# Patient Record
Sex: Female | Born: 1935 | Race: White | Hispanic: No
Health system: Southern US, Community
[De-identification: ages and names within clinical notes are randomized; demographics above are authoritative.]

---

## 2011-06-10 ENCOUNTER — Emergency Department: Payer: Self-pay | Admitting: *Deleted

## 2011-06-10 LAB — COMPREHENSIVE METABOLIC PANEL
Alkaline Phosphatase: 77 U/L (ref 50–136)
BUN: 33 mg/dL — ABNORMAL HIGH (ref 7–18)
Calcium, Total: 9.3 mg/dL (ref 8.5–10.1)
Chloride: 104 mmol/L (ref 98–107)
Co2: 26 mmol/L (ref 21–32)
EGFR (African American): 51 — ABNORMAL LOW
EGFR (Non-African Amer.): 42 — ABNORMAL LOW
Glucose: 126 mg/dL — ABNORMAL HIGH (ref 65–99)
SGOT(AST): 15 U/L (ref 15–37)
SGPT (ALT): 18 U/L
Sodium: 141 mmol/L (ref 136–145)
Total Protein: 6.2 g/dL — ABNORMAL LOW (ref 6.4–8.2)

## 2011-06-10 LAB — CBC
HGB: 11.2 g/dL — ABNORMAL LOW (ref 12.0–16.0)
MCHC: 33.3 g/dL (ref 32.0–36.0)
Platelet: 154 10*3/uL (ref 150–440)
RDW: 16.2 % — ABNORMAL HIGH (ref 11.5–14.5)
WBC: 6.1 10*3/uL (ref 3.6–11.0)

## 2011-06-11 LAB — TROPONIN I: Troponin-I: <0.02 ng/mL

## 2011-08-17 ENCOUNTER — Emergency Department: Payer: Self-pay | Admitting: Emergency Medicine

## 2011-08-17 LAB — COMPREHENSIVE METABOLIC PANEL
Albumin: 3.5 g/dL (ref 3.4–5.0)
Anion Gap: 8 (ref 7–16)
Bilirubin,Total: 0.2 mg/dL (ref 0.2–1.0)
Calcium, Total: 9.6 mg/dL (ref 8.5–10.1)
Creatinine: 0.66 mg/dL (ref 0.60–1.30)
EGFR (African American): >60
EGFR (Non-African Amer.): >60
SGPT (ALT): 11 U/L — ABNORMAL LOW
Sodium: 141 mmol/L (ref 136–145)
Total Protein: 6.5 g/dL (ref 6.4–8.2)

## 2011-08-17 LAB — URINALYSIS, COMPLETE
Blood: NEGATIVE
Glucose,UR: NEGATIVE mg/dL (ref 0–75)
Ketone: NEGATIVE
Protein: NEGATIVE
RBC,UR: 4 /HPF (ref 0–5)
Specific Gravity: 1.008 (ref 1.003–1.030)
Squamous Epithelial: 1
WBC UR: 4 /HPF (ref 0–5)

## 2011-08-17 LAB — CBC
HCT: 34.6 % — ABNORMAL LOW (ref 35.0–47.0)
HGB: 11.2 g/dL — ABNORMAL LOW (ref 12.0–16.0)
MCHC: 32.3 g/dL (ref 32.0–36.0)
MCV: 95 fL (ref 80–100)
Platelet: 149 10*3/uL — ABNORMAL LOW (ref 150–440)
RDW: 14.9 % — ABNORMAL HIGH (ref 11.5–14.5)
WBC: 6 10*3/uL (ref 3.6–11.0)

## 2011-08-17 LAB — TROPONIN I: Troponin-I: 0.05 ng/mL

## 2011-08-17 LAB — PRO B NATRIURETIC PEPTIDE: B-Type Natriuretic Peptide: 504 pg/mL — ABNORMAL HIGH (ref 0–450)

## 2011-08-18 LAB — TROPONIN I: Troponin-I: 0.05 ng/mL

## 2011-08-23 LAB — CULTURE, BLOOD (SINGLE)

## 2011-08-25 ENCOUNTER — Inpatient Hospital Stay: Payer: Self-pay | Admitting: Internal Medicine

## 2011-08-25 LAB — COMPREHENSIVE METABOLIC PANEL
Albumin: 3.7 g/dL (ref 3.4–5.0)
Bilirubin,Total: 0.3 mg/dL (ref 0.2–1.0)
Calcium, Total: 9.9 mg/dL (ref 8.5–10.1)
Chloride: 99 mmol/L (ref 98–107)
Co2: 31 mmol/L (ref 21–32)
Creatinine: 0.57 mg/dL — ABNORMAL LOW (ref 0.60–1.30)
Glucose: 99 mg/dL (ref 65–99)
Osmolality: 275 (ref 275–301)
Potassium: 4.3 mmol/L (ref 3.5–5.1)
SGOT(AST): 17 U/L (ref 15–37)
SGPT (ALT): 10 U/L — ABNORMAL LOW
Sodium: 137 mmol/L (ref 136–145)
Total Protein: 6.5 g/dL (ref 6.4–8.2)

## 2011-08-25 LAB — CBC
HCT: 32.7 % — ABNORMAL LOW (ref 35.0–47.0)
MCH: 30.9 pg (ref 26.0–34.0)
Platelet: 145 10*3/uL — ABNORMAL LOW (ref 150–440)
RBC: 3.47 10*6/uL — ABNORMAL LOW (ref 3.80–5.20)
RDW: 14.1 % (ref 11.5–14.5)
WBC: 4.9 10*3/uL (ref 3.6–11.0)

## 2011-08-25 LAB — URINALYSIS, COMPLETE
Bacteria: NONE SEEN
Bilirubin,UR: NEGATIVE
Ketone: NEGATIVE
Leukocyte Esterase: NEGATIVE
Ph: 8 (ref 4.5–8.0)
RBC,UR: NONE SEEN /HPF (ref 0–5)
Specific Gravity: 1.005 (ref 1.003–1.030)
Squamous Epithelial: NONE SEEN
WBC UR: <1 /HPF (ref 0–5)

## 2011-08-25 LAB — TSH: Thyroid Stimulating Horm: 0.811 u[IU]/mL

## 2011-08-25 LAB — LIPASE, BLOOD: Lipase: 87 U/L (ref 73–393)

## 2011-08-26 LAB — CBC WITH DIFFERENTIAL/PLATELET
Basophil %: 0.7 %
Eosinophil %: 3 %
HCT: 32.9 % — ABNORMAL LOW (ref 35.0–47.0)
HGB: 10.6 g/dL — ABNORMAL LOW (ref 12.0–16.0)
Lymphocyte #: 1 10*3/uL (ref 1.0–3.6)
Lymphocyte %: 20 %
MCV: 95 fL (ref 80–100)
Monocyte #: 0.6 x10 3/mm (ref 0.2–0.9)
Monocyte %: 11.5 %
Neutrophil #: 3.1 10*3/uL (ref 1.4–6.5)
Platelet: 150 10*3/uL (ref 150–440)
RBC: 3.47 10*6/uL — ABNORMAL LOW (ref 3.80–5.20)
RDW: 14.4 % (ref 11.5–14.5)
WBC: 4.8 10*3/uL (ref 3.6–11.0)

## 2011-08-26 LAB — BASIC METABOLIC PANEL
Anion Gap: 7 (ref 7–16)
BUN: 15 mg/dL (ref 7–18)
Calcium, Total: 9.6 mg/dL (ref 8.5–10.1)
Chloride: 101 mmol/L (ref 98–107)
Co2: 32 mmol/L (ref 21–32)
EGFR (African American): >60
Glucose: 107 mg/dL — ABNORMAL HIGH (ref 65–99)
Osmolality: 281 (ref 275–301)
Sodium: 140 mmol/L (ref 136–145)

## 2011-08-31 ENCOUNTER — Encounter: Payer: Self-pay | Admitting: Internal Medicine

## 2011-09-11 ENCOUNTER — Encounter: Payer: Self-pay | Admitting: Internal Medicine

## 2011-10-08 ENCOUNTER — Inpatient Hospital Stay: Payer: Self-pay | Admitting: Internal Medicine

## 2011-10-08 LAB — CBC
HCT: 29.8 % — ABNORMAL LOW (ref 35.0–47.0)
HGB: 9.9 g/dL — ABNORMAL LOW (ref 12.0–16.0)
MCH: 30.9 pg (ref 26.0–34.0)
Platelet: 147 10*3/uL — ABNORMAL LOW (ref 150–440)
RBC: 3.21 10*6/uL — ABNORMAL LOW (ref 3.80–5.20)
RDW: 14.1 % (ref 11.5–14.5)
WBC: 7.1 10*3/uL (ref 3.6–11.0)

## 2011-10-08 LAB — URINALYSIS, COMPLETE
Glucose,UR: NEGATIVE mg/dL (ref 0–75)
Ketone: NEGATIVE
WBC UR: 65 /HPF (ref 0–5)

## 2011-10-08 LAB — COMPREHENSIVE METABOLIC PANEL
Albumin: 3.3 g/dL — ABNORMAL LOW (ref 3.4–5.0)
Anion Gap: 9 (ref 7–16)
BUN: 25 mg/dL — ABNORMAL HIGH (ref 7–18)
Calcium, Total: 9.5 mg/dL (ref 8.5–10.1)
Chloride: 99 mmol/L (ref 98–107)
Creatinine: 0.83 mg/dL (ref 0.60–1.30)
EGFR (African American): >60
SGOT(AST): 18 U/L (ref 15–37)
SGPT (ALT): 12 U/L
Sodium: 135 mmol/L — ABNORMAL LOW (ref 136–145)

## 2011-10-08 LAB — CK TOTAL AND CKMB (NOT AT ARMC): CK, Total: 38 U/L (ref 21–215)

## 2011-10-08 LAB — HEMOGLOBIN A1C: Hemoglobin A1C: 6.1 % (ref 4.2–6.3)

## 2011-10-08 LAB — MAGNESIUM: Magnesium: 1.5 mg/dL — ABNORMAL LOW

## 2011-10-08 LAB — TSH: Thyroid Stimulating Horm: 1.08 u[IU]/mL

## 2011-10-08 LAB — T4, FREE: Free Thyroxine: 1.6 ng/dL — ABNORMAL HIGH (ref 0.76–1.46)

## 2011-10-11 LAB — BASIC METABOLIC PANEL
Anion Gap: 8 (ref 7–16)
BUN: 7 mg/dL (ref 7–18)
Calcium, Total: 9.7 mg/dL (ref 8.5–10.1)
Chloride: 105 mmol/L (ref 98–107)
Co2: 28 mmol/L (ref 21–32)
Creatinine: 0.55 mg/dL — ABNORMAL LOW (ref 0.60–1.30)
EGFR (African American): >60
EGFR (Non-African Amer.): >60
Glucose: 154 mg/dL — ABNORMAL HIGH (ref 65–99)

## 2011-10-11 LAB — CBC WITH DIFFERENTIAL/PLATELET
Basophil #: 0 10*3/uL (ref 0.0–0.1)
Basophil %: 0.4 %
Eosinophil #: 0.1 10*3/uL (ref 0.0–0.7)
Eosinophil %: 0.8 %
HGB: 11.7 g/dL — ABNORMAL LOW (ref 12.0–16.0)
Lymphocyte #: 0.9 10*3/uL — ABNORMAL LOW (ref 1.0–3.6)
Monocyte #: 0.7 x10 3/mm (ref 0.2–0.9)
Monocyte %: 10.2 %
Neutrophil #: 5.4 10*3/uL (ref 1.4–6.5)
Platelet: 172 10*3/uL (ref 150–440)

## 2011-10-12 LAB — TSH: Thyroid Stimulating Horm: 1.15 u[IU]/mL

## 2011-10-13 LAB — BASIC METABOLIC PANEL
Anion Gap: 10 (ref 7–16)
BUN: 16 mg/dL (ref 7–18)
Co2: 26 mmol/L (ref 21–32)
Creatinine: 0.71 mg/dL (ref 0.60–1.30)
Osmolality: 275 (ref 275–301)
Potassium: 4.1 mmol/L (ref 3.5–5.1)

## 2011-10-13 LAB — CBC WITH DIFFERENTIAL/PLATELET
Basophil #: 0.1 10*3/uL (ref 0.0–0.1)
Basophil %: 1.3 %
Eosinophil #: 0.1 10*3/uL (ref 0.0–0.7)
Eosinophil %: 2.5 %
HCT: 34.2 % — ABNORMAL LOW (ref 35.0–47.0)
Lymphocyte #: 1.4 10*3/uL (ref 1.0–3.6)
MCV: 93 fL (ref 80–100)
Monocyte #: 0.6 x10 3/mm (ref 0.2–0.9)
Neutrophil #: 3.3 10*3/uL (ref 1.4–6.5)
Platelet: 184 10*3/uL (ref 150–440)
WBC: 5.5 10*3/uL (ref 3.6–11.0)

## 2011-12-12 ENCOUNTER — Ambulatory Visit: Payer: Self-pay | Admitting: Internal Medicine

## 2012-01-03 ENCOUNTER — Inpatient Hospital Stay: Payer: Self-pay | Admitting: Internal Medicine

## 2012-01-03 LAB — COMPREHENSIVE METABOLIC PANEL WITH GFR
Albumin: 3.2 g/dL — ABNORMAL LOW
Alkaline Phosphatase: 91 U/L
Anion Gap: 6 — ABNORMAL LOW
BUN: 37 mg/dL — ABNORMAL HIGH
Bilirubin,Total: 0.2 mg/dL
Calcium, Total: 9.1 mg/dL
Chloride: 88 mmol/L — ABNORMAL LOW
Co2: 35 mmol/L — ABNORMAL HIGH
Creatinine: 1.31 mg/dL — ABNORMAL HIGH
EGFR (African American): 44 — ABNORMAL LOW
EGFR (Non-African Amer.): 38 — ABNORMAL LOW
Glucose: 155 mg/dL — ABNORMAL HIGH
Osmolality: 271
Potassium: 5.2 mmol/L — ABNORMAL HIGH
SGOT(AST): 17 U/L
SGPT (ALT): 13 U/L
Sodium: 129 mmol/L — ABNORMAL LOW
Total Protein: 6.5 g/dL

## 2012-01-03 LAB — CBC WITH DIFFERENTIAL/PLATELET
Basophil #: 0 10*3/uL (ref 0.0–0.1)
Eosinophil #: 0 10*3/uL (ref 0.0–0.7)
HCT: 29.2 % — ABNORMAL LOW (ref 35.0–47.0)
HGB: 9.6 g/dL — ABNORMAL LOW (ref 12.0–16.0)
Lymphocyte #: 0.2 10*3/uL — ABNORMAL LOW (ref 1.0–3.6)
Lymphocyte %: 4.6 %
MCHC: 32.9 g/dL (ref 32.0–36.0)
MCV: 89 fL (ref 80–100)
Monocyte %: 1.6 %
Neutrophil #: 4.1 10*3/uL (ref 1.4–6.5)
Neutrophil %: 93.8 %
RDW: 13.7 % (ref 11.5–14.5)
WBC: 4.4 10*3/uL (ref 3.6–11.0)

## 2012-01-03 LAB — URINALYSIS, COMPLETE
Bilirubin,UR: NEGATIVE
Blood: NEGATIVE
Glucose,UR: NEGATIVE mg/dL
Ketone: NEGATIVE
Leukocyte Esterase: NEGATIVE
Nitrite: NEGATIVE
Ph: 6
Protein: NEGATIVE
RBC,UR: <1 /HPF
Specific Gravity: 1.008
Squamous Epithelial: 8
WBC UR: <1 /HPF

## 2012-01-03 LAB — TROPONIN I: Troponin-I: <0.02 ng/mL

## 2012-01-03 LAB — CK TOTAL AND CKMB (NOT AT ARMC)
CK, Total: 49 U/L
CK-MB: 1.4 ng/mL

## 2012-01-04 LAB — BASIC METABOLIC PANEL
BUN: 38 mg/dL — ABNORMAL HIGH (ref 7–18)
Chloride: 89 mmol/L — ABNORMAL LOW (ref 98–107)
Co2: 37 mmol/L — ABNORMAL HIGH (ref 21–32)
Creatinine: 1.13 mg/dL (ref 0.60–1.30)
EGFR (Non-African Amer.): 45 — ABNORMAL LOW
Potassium: 4.7 mmol/L (ref 3.5–5.1)
Sodium: 130 mmol/L — ABNORMAL LOW (ref 136–145)

## 2012-01-04 LAB — CK TOTAL AND CKMB (NOT AT ARMC)
CK, Total: 36 U/L (ref 21–215)
CK, Total: 34 U/L (ref 21–215)
CK-MB: 1.2 ng/mL (ref 0.5–3.6)
CK-MB: 1 ng/mL (ref 0.5–3.6)

## 2012-01-04 LAB — TROPONIN I
Troponin-I: <0.02 ng/mL
Troponin-I: <0.02 ng/mL

## 2012-01-05 LAB — BASIC METABOLIC PANEL
Anion Gap: 5 — ABNORMAL LOW (ref 7–16)
Calcium, Total: 9.2 mg/dL (ref 8.5–10.1)
Co2: 37 mmol/L — ABNORMAL HIGH (ref 21–32)
Creatinine: 0.83 mg/dL (ref 0.60–1.30)
EGFR (African American): >60
EGFR (Non-African Amer.): >60
Potassium: 4.3 mmol/L (ref 3.5–5.1)
Sodium: 134 mmol/L — ABNORMAL LOW (ref 136–145)

## 2012-01-07 LAB — BASIC METABOLIC PANEL
Calcium, Total: 8.9 mg/dL (ref 8.5–10.1)
Co2: 39 mmol/L — ABNORMAL HIGH (ref 21–32)
Creatinine: 0.84 mg/dL (ref 0.60–1.30)
EGFR (African American): >60
EGFR (Non-African Amer.): >60
Glucose: 152 mg/dL — ABNORMAL HIGH (ref 65–99)
Potassium: 3.8 mmol/L (ref 3.5–5.1)
Sodium: 134 mmol/L — ABNORMAL LOW (ref 136–145)

## 2012-01-07 LAB — HEMOGLOBIN: HGB: 11.3 g/dL — ABNORMAL LOW (ref 12.0–16.0)

## 2012-01-09 LAB — CULTURE, BLOOD (SINGLE)

## 2012-01-12 ENCOUNTER — Ambulatory Visit: Payer: Self-pay | Admitting: Internal Medicine

## 2012-01-12 LAB — CULTURE, BLOOD (SINGLE)

## 2012-01-13 LAB — CULTURE, BLOOD (SINGLE)

## 2013-04-02 ENCOUNTER — Ambulatory Visit: Payer: Self-pay | Admitting: Specialist

## 2013-08-30 ENCOUNTER — Ambulatory Visit: Payer: Self-pay | Admitting: Specialist

## 2014-04-02 IMAGING — CT CT ABD-PELV W/O CM
1 of 2 series · 15 of 32 positions shown, 19 images · non-contrast
Comparison: none

REASON FOR EXAM: (1) abd pain, contrast allergy; (2) abd pain, contrast
allergy
COMMENTS:

PROCEDURE:     CT  - CT ABDOMEN AND PELVIS W[DATE]  [DATE]
RESULT:     CT abdomen and pelvis dated 08/17/2011.
TECHNIQUE: Helical noncontrasted 3 mm sections were obtained from the lung
bases through the pubic symphysis.

[Series 2: 3mm soft tissue · axial · 0.70mm/px · z∈[-447,-15]mm · 15 of 158 slices shown, 19 images]
[im 7/158  soft-tissue]
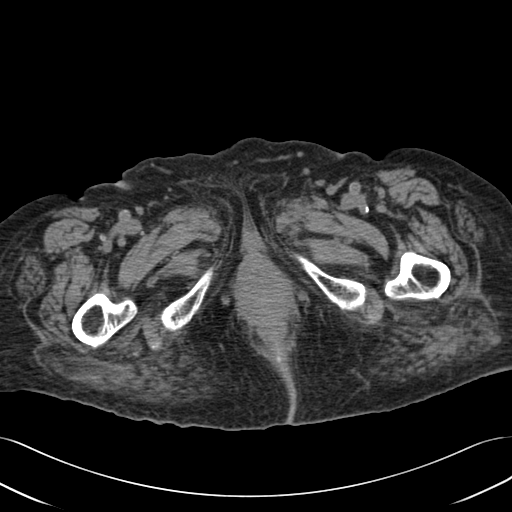
[im 7/158  bone]
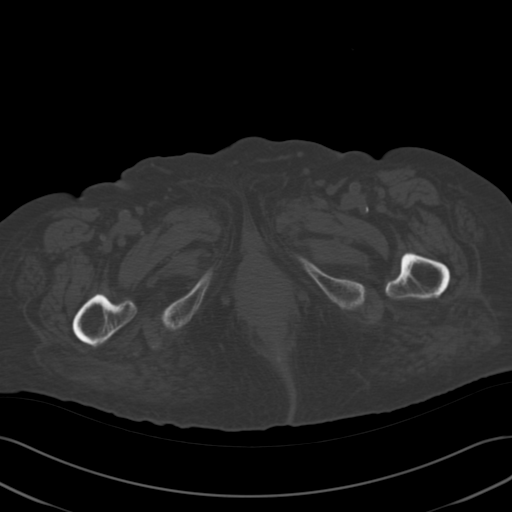
[im 20/158  soft-tissue]
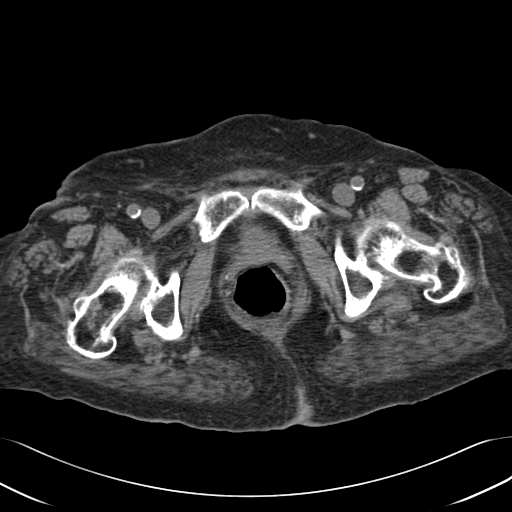
[im 33/158  soft-tissue]
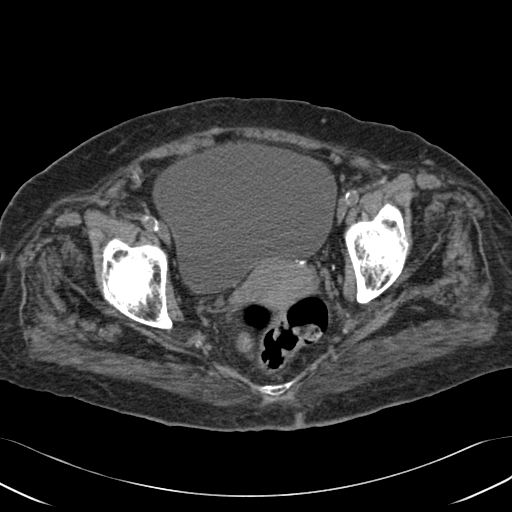
[im 46/158  soft-tissue]
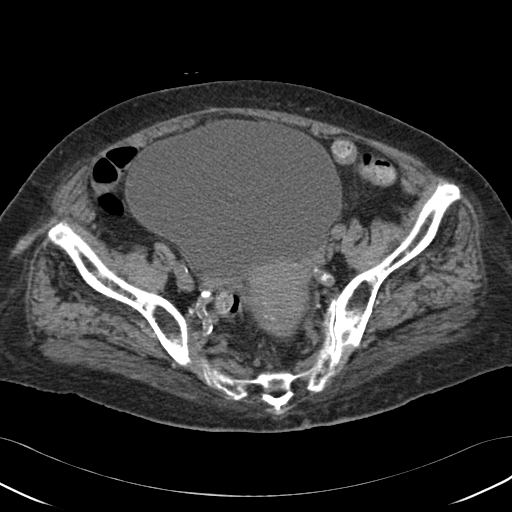
[im 53/158  soft-tissue]
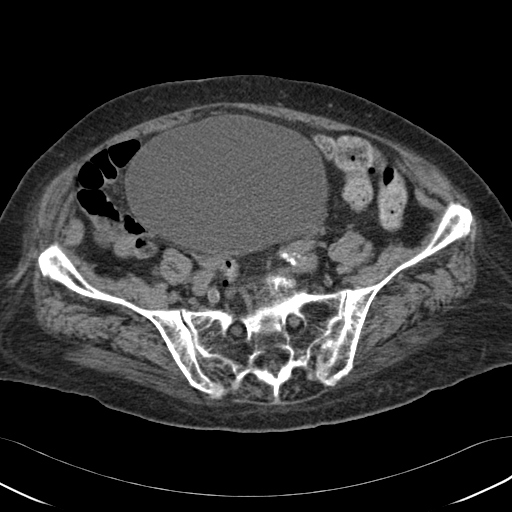
[im 66/158  soft-tissue]
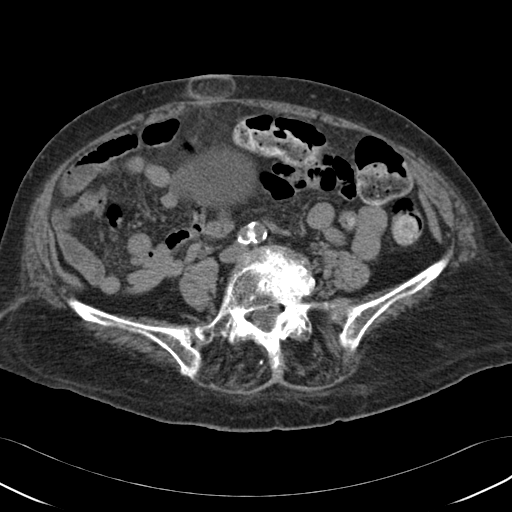
[im 79/158  soft-tissue]
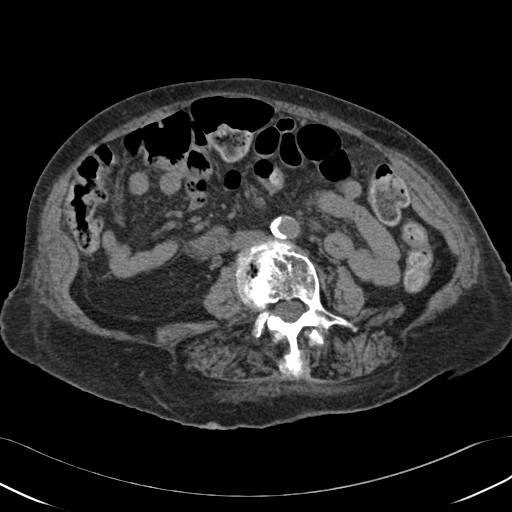
[im 92/158  soft-tissue]
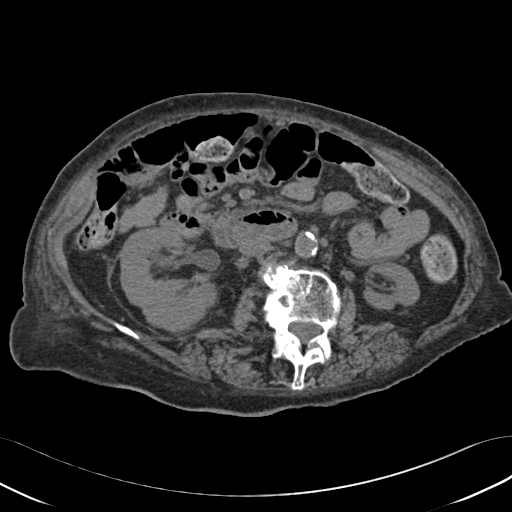
[im 105/158  soft-tissue]
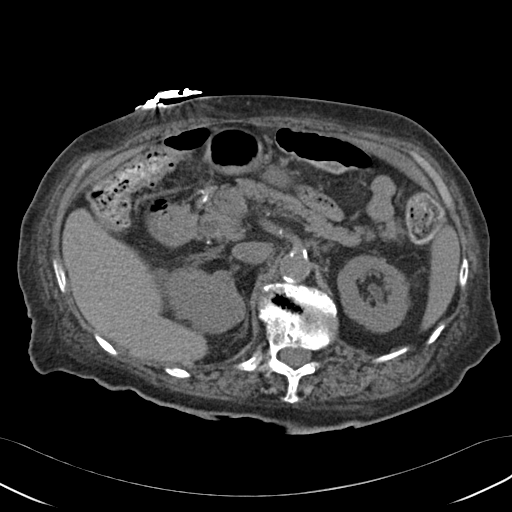
[im 105/158  bone]
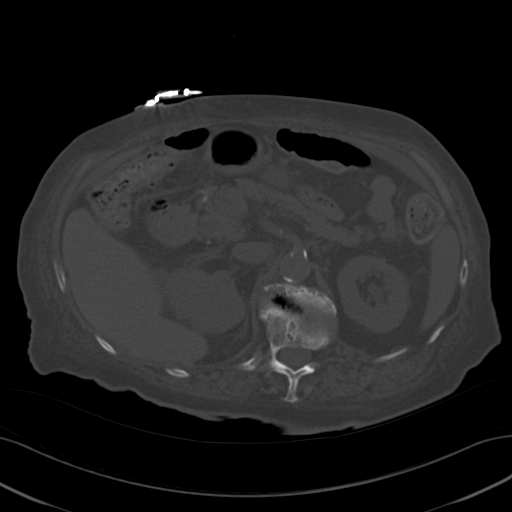
[im 112/158  soft-tissue]
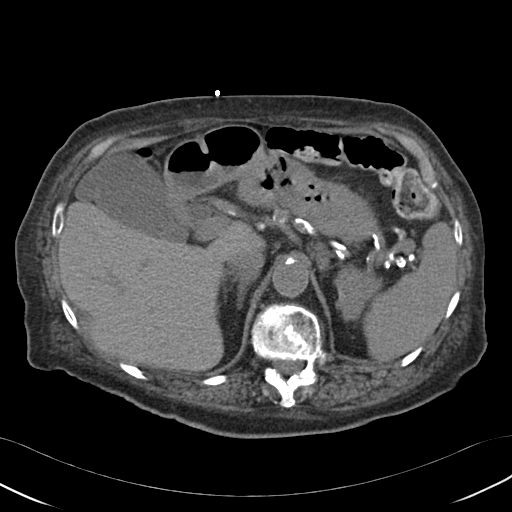
[im 125/158  soft-tissue]
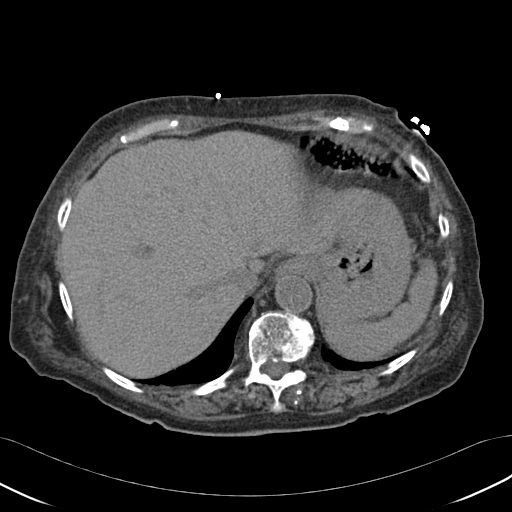
[im 131/158  lung]
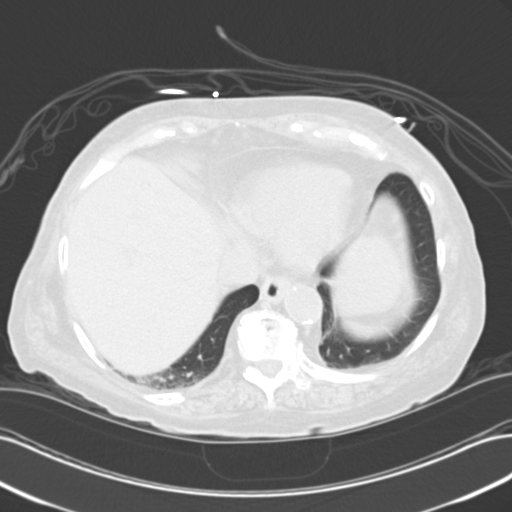
[im 138/158  soft-tissue]
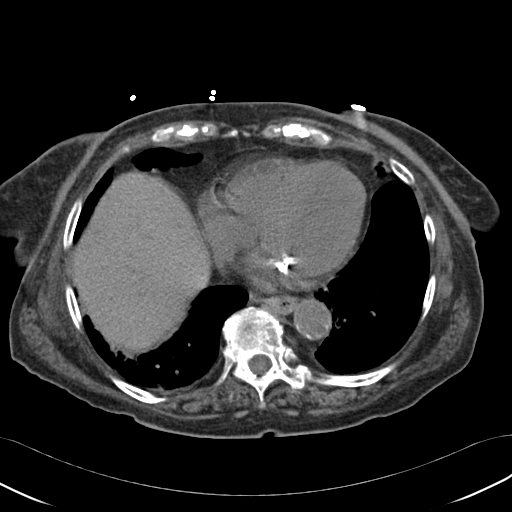
[im 138/158  lung]
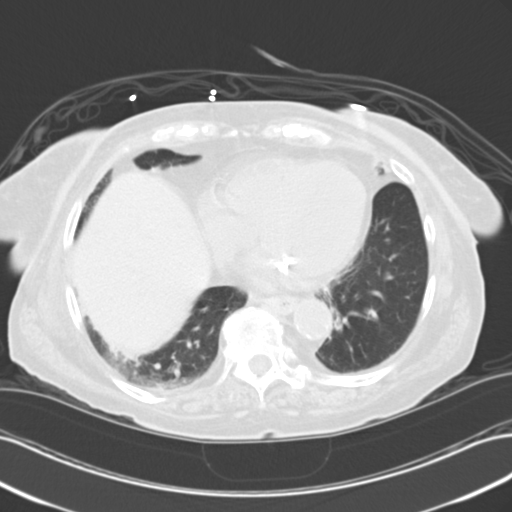
[im 144/158  lung]
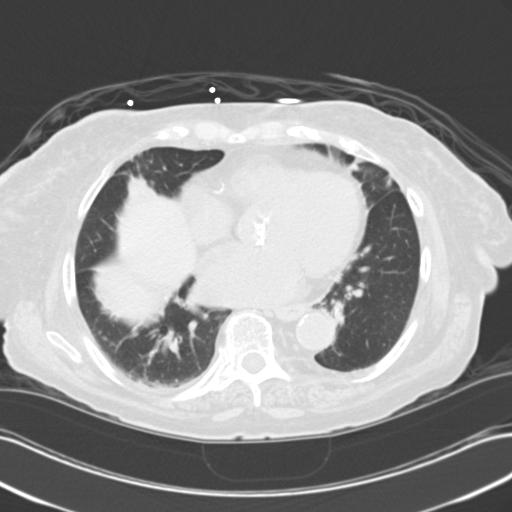
[im 151/158  soft-tissue]
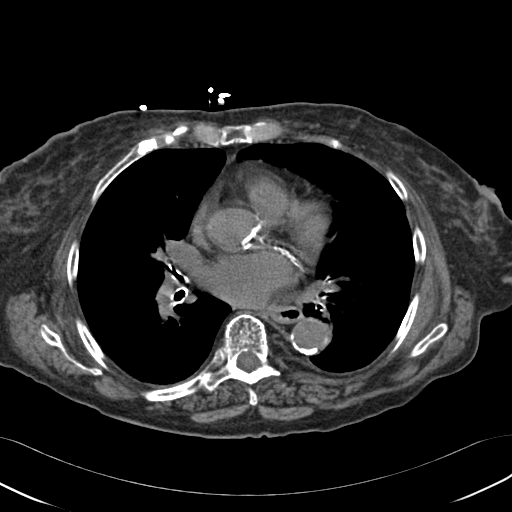
[im 151/158  lung]
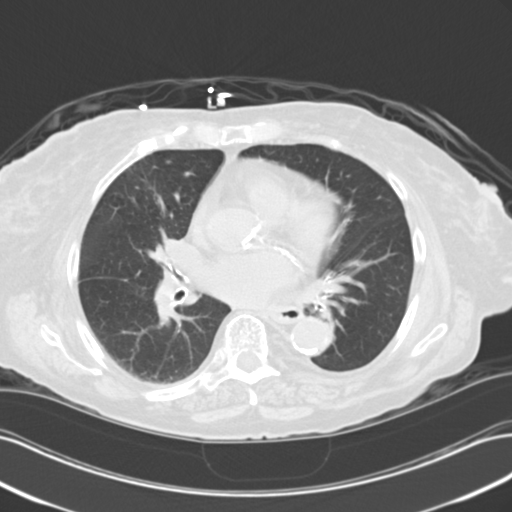

[15 of 32 positions shown; findings below may reference images not displayed]

FINDINGS: Hypoventilation identified within the lung bases right greater
than left.

Noncontrast evaluation of the liver, spleen, adrenals, pancreas and left
kidney is unremarkable. Pelvocaliectasis is identified involving the right
kidney as well as mild ureteral dilation to the level of the urinary
bladder. The urinary bladder is severely distended with fluid likely urine.
These findings are concerning for a bladder outlet obstruction. No CT
evidence of bowel obstruction. There are findings which may represent
diffuse thickening of the stomach wall versus nondistention. There is no
evidence of an abdominal aortic aneurysm. The previous described biliary
findings are unchanged. A persistent fat-containing umbilical hernia is
appreciated. The appendix is not clearly identified within no secondary
signs reflecting appendicitis. There is no evidence of abdominal or pelvic
masses, free fluid nor loculated fluid collections. Coarse vascular
calcifications are identified within the mesentery vessels.
IMPRESSION: Severe distention of the urinary bladder concerning for
bladder outlet obstruction. The urinary bladder distention has slightly
increased when compared to previous study.
2. Abdomen pelvis otherwise unchanged compared to previous study dated
08/17/2011.
[DATE]. Multichamber cardiac enlargement is identified.

## 2014-08-30 NOTE — Discharge Summary (Signed)
PATIENT NAME:  Erica Carey, Erica Carey MR#:  [id number] DATE OF BIRTH:  May 08, 1927  DATE OF ADMISSION:  01/03/2012 DATE OF DISCHARGE:  01/08/2012  DISCHARGE DIAGNOSES:  1. Acute hypoxic respiratory failure possibly due to acute chronic obstructive pulmonary disease exacerbation and acute diastolic congestive heart failure.  2. Hypertension.  3. History of atrial fibrillation, currently in sinus rhythm.  4. Hypokalemia.  5. Hyponatremia.  6. Legally blind. 7. Hypothyroidism. 8. Acute renal failure, resolved.  9. Diabetes.  10. Anemia.  11. History of peptic ulcer disease/gastritis. 12. Recurrent urinary tract infections. 13. Not on any anticoagulation due to peptic ulcer disease and gastric ulcers.  14. Anxiety. 15. Chronic pain syndrome. 16. Chronic obstructive pulmonary disease, on home oxygen.   DISPOSITION: The patient is being discharged to her skilled nursing facility, [facility].   DIET: Low sodium, 1800 calorie ADA diet.   ACTIVITY: As tolerated.   FOLLOWUP: Follow up with primary care physician, Dr. Clayborn Bigness, in 1 to 2 weeks after discharge.   DISCHARGE MEDICATIONS:  1. Loratadine 10 mg daily.  2. Amlodipine 10 mg daily.  3. Lopressor 100 mg b.i.d.  4. Symbicort 1 puff b.i.d.  5. Lidoderm 5%, apply in the morning and remove at bedtime.  6. Neurontin 100 mg t.i.d.  7. Lipitor 20 mg daily.   8. ProAir HFA 2 puffs q.i.d. as needed. 9. Albuterol nebulizers q.i.d. as needed.  10. Tramadol 50 mg every 6 hours p.r.n.  11. Allopurinol 300 mg daily.  12. Omeprazole 40 mg daily.  13. Cepacol throat lozenges b.i.d. as needed for sore throat. 14. Robitussin-DM 5 mL every 4 hours p.r.n. cough.  15. Prednisone taper.  16. Norco 5/325, 1 tablet every 8 hours p.r.n.  17. Metformin 500 mg b.i.d.  18. Lasix 4 mg b.i.d.  19. Tylenol 650 mg every 4 hours p.r.n.  20. Lisinopril 40 mg b.i.d.  21. Clonidine 0.1 mg daily.  22. Methimazole 10 mg daily.  23. Nystatin powder apply to  affected area b.i.d.  24. KCl 30 mEq/15 mL, 10 mL daily.   CONSULTATION: Palliative Care consultation with Dr. Harvie Junior.   CODE STATUS: The patient is a DO NOT RESUSCITATE.   LABORATORY, DIAGNOSTIC AND RADIOLOGICAL DATA: Chest x-ray: Mild atelectasis. Mild congestive heart failure. Microbiology: Blood cultures grown on 01/03/2012 showed gram-positive cocci in one out of four bottles, possibly contaminant. Repeat blood cultures done on 01/07/2012 showed no growth so far. Normal white count. Hemoglobin 9.6 to 11.3, platelet count normal. BNP 2053. Glucose 155, BUN 37, creatinine 1.31 on admission, normal by the time of discharge. Sodium 129 on admission, 134 by the time of discharge. Potassium 5.3 on admission, normal by the time of discharge. Normal cardiac enzymes. Normal LFTs.   HOSPITAL COURSE: The patient is an 81 year old female with past medical history of atrial fibrillation and currently in sinus, not on any anticoagulation due to history of gastritis, large superficial ulcers found on endoscopy 10/2011, hypertension, chronic obstructive pulmonary disease, on chronic home oxygen, presented with shortness of breath. She was admitted with a diagnosis of acute on chronic hypoxic hypercarbic respiratory failure. Differential diagnoses included acute diastolic congestive heart failure. Her echo in the past had shown normal ejection fraction. She also had an acute chronic obstructive pulmonary disease exacerbation. Her chest x-ray showed no pneumonia or infiltrate, therefore, she was not treated with any antibiotics. She was initially on a BiPAP and then on a high-flow nasal cannula. Currently she is on 2 to 3 liters of oxygen via nasal  cannula and is comfortable. She was treated with nebulizer treatments, Symbicort and IV steroids and has been switched to oral steroids. She was also treated with IV Lasix and has been switched to oral Lasix. Her blood cultures and cardiac enzymes were negative. Her blood  pressure was intermittently elevated but overall controlled on her current regimen. She has a history of atrial fibrillation but has been in normal sinus. She is not on any anticoagulation due to history of peptic ulcer disease and gastric ulcer that was found on endoscopy 10/2011. She had electrolyte abnormalities including hypokalemia and hyponatremia which have resolved. She had acute renal failure which has normalized. Her metformin was initially held because her oral intake was poor.  Currently, her oral intake is adequate and her metformin has been restarted.   When the patient presented to the hospital, she  was very ill, therefore, a Palliative Care consultation was obtained. The patient is a DO NOT RESUSCITATE.  Her family is aware that she will be returning to her skilled nursing facility. Blood cultures sent on 01/03/2012 showed gram-positive cocci in one out of four bottles. This was felt to be a contaminant since the patient had no fever and had normal white count. Repeat blood cultures have been negative so far. The patient is being discharged to her facility in a stable condition.   TIME SPENT: 45 minutes.  ____________________________ Darrick MeigsSangeeta Constance Whittle, MD sp:cbb D: 01/08/2012 11:18:28 ET T: 01/08/2012 11:35:11 ET JOB#: [id number]  cc: Darrick MeigsSangeeta Brilee Port, MD, <Dictator> Burley SaverL. Katherine Bliss, MD Darrick MeigsSANGEETA Akili Corsetti MD ELECTRONICALLY SIGNED 01/08/2012 16:31

## 2014-08-30 NOTE — H&P (Signed)
PATIENT NAME:  Erica Carey, Erica Carey MR#:  [id number] DATE OF BIRTH:  Nov 24, 1926  DATE OF ADMISSION:  01/03/2012  PRIMARY CARE PHYSICIAN: Joen Laura, MD  CHIEF COMPLAINT: Shortness of breath.   History is obtained from ER physician, old records, and patient's son, Mr. Ashlyne Olenick. The patient is a poor historian. Hard on hearing, unable to give much history.   HISTORY OF PRESENT ILLNESS: Erica Carey an 81 year old Caucasian female who is a resident at [facility] nursing home, who has history of multiple medical problems, comes into the emergency room for increasing shortness of breath for past couple of days, more so today. The patient was recently started on Septra for her urinary tract infection and Levaquin for respiratory infection along with prednisone taper at the nursing home. She continued to decline with increasing shortness of breath. Saturations were found to be in the 70s by EMS. She was placed on nonrebreather, and saturations in the emergency room when she arrived was 98%. The patient was found to be in congestive heart failure along with shortness of breath and hypoxia, placed on BiPAP. She stayed on BiPAP for about 3 hours in the emergency room, received 40 of IV Lasix and her saturations have now improved and is down to nasal cannula 3 liters nasal cannula oxygen during my evaluation. She is being admitted for acute hypoxic, hypercarbic respiratory failure secondary to congestive heart failure, acute systolic, with possible chronic obstructive pulmonary disease exacerbation.   PAST MEDICAL HISTORY:  1. Gastritis with large superficial ulcers found on endoscopy in June of 2013.  2. Urinary tract infection.  3. History of atrial fibrillation with rapid ventricular response. No anticoagulation recommended given her history of superficial large ulcers. Gastric ulcers.  4. Hypertension.  5. Osteoarthritis.  6. Chronic pain and chronic narcotic dependence.  7. Anxiety/dementia.   8. History of hyperthyroidism on methimazole.    9. Chronic obstructive pulmonary disease, on 2 liters of nasal cannula oxygen at home.   PAST SURGICAL HISTORY:  1. Lens implant in both eyes.  2. Bilateral knee surgery.  3. Lumpectomy from breast which was a benign mass.   ALLERGIES: Seafood and contrast dye.   SOCIAL HISTORY: She lives in [place]. No history of alcohol or smoking. Has been exposed to passive smoking from her husband.   FAMILY HISTORY: Mother died at young age from childbirth complications. Father died in age 44s from heart disease. This is obtained from old records.   REVIEW OF SYSTEMS: Not much obtainable secondary to patient being hard on hearing and shortness of breath, currently on BiPAP. She complains of shortness of breath.   MEDICATIONS:  1. Vitamin C 500 mg p.o. daily.  2. Symbicort 160/4.5 one puff b.i.d.  3. ProAir HFA 90 mcg inhalations 2 puffs 4 times daily.  4. KCl 20 mEq p.o. daily.  5. Omeprazole 40 mg daily.  6. Metoprolol tartrate 100 mg b.i.d.  7. Methimazole 10 mg p.o. daily.  8. Metformin 1000 mg daily.  9. Mag-Ox 400 mg b.i.d.  10. Loratadine 10 mg p.o. daily.  11. Lisinopril 40 mg twice a day.  12. Lidoderm 5% topical film, apply 1 patch topically daily.  13. Lasix 40 mg daily.  14. Doxazosin 8 mg p.o. daily.  15. Docusate 100 mg b.i.d.  16. Clonidine 0.1 mg p.o. daily.  17. Atorvastatin 20 mg daily.  18. Amlodipine 10 mg daily.  19. Allopurinol 300 mg daily.  20. Acetaminophen/hydrocodone 5/325 one to 2 q.4 p.r.n.   PHYSICAL EXAMINATION:  GENERAL: The patient is awake. She is hard on hearing, not in acute distress at present, currently on BiPAP.   VITAL SIGNS: She is afebrile, pulse was 74, blood pressure 124/54, saturations are 97% on BiPAP.   HEENT: Atraumatic, normocephalic. PERLA, EOMI intact. Oral mucosa is dry.   NECK: Supple. No JVD. No carotid bruit.   RESPIRATORY: Decreased breath sounds in the bases. No respiratory  distress at present or labored breathing. The patient currently is on BiPAP. Could hear some audible wheezing. No crackles heard.   CARDIOVASCULAR: Both the heart sounds are normal. Rate, rhythm regular. PMI not lateralized. No murmur heard. No thrill felt. PMI not lateralized. Chest nontender.   ABDOMEN: Obese, soft, nontender. No organomegaly. Positive bowel sounds.   EXTREMITIES: Patient has pitting edema 3+ in both the lower extremities up to the knee joint.  I could not appreciate much pedal pulses due to edema, however good femoral pulses.   NEUROLOGIC: Unable to assess the patient on BiPAP, however, no focal neurological deficit.   SKIN: Warm and dry.   LABORATORY DATA: Urinalysis negative for urinary tract infection. A pH is 7.38, pCO2 is 66, pO2 of 83. This is on BiPAP. Saturations were 95%. Chest x-ray shows mild developing congestive heart failure, mild bilateral atelectasis. White count is 4.4, hemoglobin is 9.6 and hematocrit 29.2, platelet count is 161,000. Glucose is 155, BUN is 37, creatinine 1.31. Sodium is 129, potassium is 5.2, chloride is 88, bicarbonate is 35. LFTs within normal limits. Albumin is 3.2. Cardiac enzymes, first set negative. B-type natriuretic peptide is 2053. EKG shows sinus rhythm with first degree AV block.   ASSESSMENT: An 81 year old Erica Carey with:  1. Acute hypoxic, hypercarbic respiratory failure due to congestive heart failure, acute diastolic along with chronic obstructive pulmonary disease flare. The patient recently has had weight gain, shortness of breath. Saturations were found to be in the 70s, wears chronic oxygen 2.5 liters at home. She has recently had a lot of leg edema along with elevated BNP, which suggests acute diastolic heart failure. Her echocardiogram done in June of 2013 shows ejection fraction of 55%. We will admit patient to telemetry floor, wean her off BiPAP as tolerated. Continue IV Lasix around-the-clock. Monitor his inputs and  outputs, and electrolytes. We will also wait and monitor daily weights. No indication for echocardiogram at this time. Consider cardiology consultation if needed. The patient was seen by Dr. Darrold JunkerParaschos in the past.  2. Hypertension. Continue home medications.  3. History of atrial fibrillation, now in sinus rhythm. No anticoagulation due to history of peptic ulcer disease with gastric ulcers diagnosed recently in June of 2013 on endoscopy.  4. Hyperkalemia, hyponatremia due to volume overload. We will monitor electrolytes after IV Lasix diuresis.  5. Legally blind due to side effects from uncontrolled hypertension in the past per family.  6. Hyperthyroidism, on methimazole.   7. Chronic obstructive pulmonary disease on 2.5 liters oxygen at home. We will continue the prednisone taper. Continue Advair, albuterol inhaler.  8. Further workup according to the patient's clinical course.  9. Hospital admission plan was discussed with patient, patient's son Yates DecampJoseph Noack, who is patient's HCPOA.    CODE STATUS: MR [place] CONFIRMS PATIENT IS A NO CODE, DO NOT RESUSCITATE.    CRITICAL TIME SPENT: 50 minutes.   ____________________________ Wylie HailSona A. Allena KatzPatel, MD sap:vtd D: 01/03/2012 20:28:58 ET T: 01/04/2012 07:21:39 ET JOB#: [id number]  cc: Awad Gladd A. Allena KatzPatel, MD, <Dictator> Burley SaverL. Katherine Bliss, MD Willow OraSONA A Persia Lintner MD ELECTRONICALLY  SIGNED 01/04/2012 14:12

## 2014-09-04 NOTE — H&P (Signed)
PATIENT NAME:  Erica Carey, Erica Carey MR#:  [id number] DATE OF BIRTH:  May 03, 1927  DATE OF ADMISSION:  08/25/2011  PRIMARY MD: Dr. Quillian QuinceBliss   ED REFERRING DOCTOR: Dr. Mayford KnifeWilliams    CHIEF COMPLAINT: Abdominal pain, urinary retention, generalized weakness.   HISTORY OF PRESENT ILLNESS: The patient is an 81 year old white female who recently moved here from [place] in January who currently lives in assisted living who is brought in for a second time within the past two weeks with complaint of abdominal pain. She was brought in earlier. The patient was given morphine two doses for pain and she had a CT scan which showed a significant amount of urinary retention. The patient had a Foley placed and she had a good amount of urine that came with Foley. The patient also on presentation had a blood pressure of 227/97. The plan was for her to get the Foley and be discharged. However, there was concern with the family that she needed more help than she was getting at the assisted living and may need a higher level of care. According to the son, the patient has had difficulty with vision since her blood pressure had gone up in the 300's prior to her coming from [place] and since then she has had issues with her vision which has improved but still she has difficulty with finding pills, cooking, et Karie Sodacetera. He also reports that she has had progressive difficulty with forgetfulness. He feels that she may have dementia. She also gets very easily agitated. The patient currently is very sleepy as a result of receiving the pain medications and she is not able to give me her history. I did speak to the son; even though he has brought her from [place] he was not able to give me her complete history. Was able to obtain a medication list from her recent visit that I was able to extrapolate some of her previous history.   PAST MEDICAL HISTORY: According to the son, she has a history of: 1. Labile hypertension. 2. Loss of sight after her  episode of extremely high blood pressure in the 300's. 3. History of diverticulitis.  4. Anxiety disorder.  5. History of breast cancer. 6. Based on her medications, she also likely has hyperlipidemia.  7. COPD/asthma. 8. Hyperthyroidism. 9. Gastroesophageal reflux disease.  10. Diabetes, is on metformin.   PAST SURGICAL HISTORY: The only surgery she was able to tell me was that she has had a mastectomy and a knee surgery.   ALLERGIES: Contrast dye and seafood.  MEDICATIONS: These were medications from her recent visit. Her daughter-in-law is to bring an up-to-date list.  1. Tylenol 650 two tabs q.4 as needed.  2. Norco 5/325 one tab p.o. q.6 p.r.n.  3. Albuterol inhalation p.r.n.  4. Allopurinol 300 daily.  5. Alprazolam 0.25 mg 3 times per day as needed. 6. Atorvastatin 20 daily.  7. Diltiazem 180 daily.  8. Colace 100 one tab p.o. b.i.d.  9. Doxazosin 8 mg daily.  10. Lasix 40 daily.  11. Hyoscyamine 0.125 one tab 4 times per day.  12. Lidoderm transdermally 2 times per day. 13. Lisinopril 40 daily.  14. Loratadine 10 daily.  15. Mag oxide 400 one tab p.o. b.i.d.  16. Metformin 500 p.o. b.i.d.  17. Methimazole 10 daily.  18. Mirtazapine 15 mg at bedtime.  19. Omeprazole 40 daily.  20. ProAir HFA 2 puffs 4 times per day.  21. Symbicort 160 2 puffs b.i.d.  22. Vitamin C 500 p.o. daily.  SOCIAL HISTORY: Nonsmoker. No alcohol. No drugs. Currently lives in an assisted living facility.  FAMILY HISTORY: Positive for hypertension.   REVIEW OF SYSTEMS: Unobtainable due to the patient being drowsy.   PHYSICAL EXAMINATION:   VITAL SIGNS: Temperature 98, pulse 84, respirations 22, blood pressure 141/65, O2 97% on room air.   GENERAL: The patient is an obese female currently very drowsy, opens her eyes and then goes back to sleep.   HEENT: Head atraumatic, normocephalic. Pupils equally round, reactive to light and accommodation. Unable to do extraocular movements. There is no  conjunctival pallor. No scleral icterus. Oropharynx is clear without any exudates.   NECK: There is no thyromegaly. No carotid bruits.   CARDIOVASCULAR: Regular rate and rhythm. No murmurs, rubs, clicks, or gallops. PMI is not displaced.   LUNGS: Clear to auscultation bilaterally without any rales, rhonchi, or wheezing.   ABDOMEN: Soft, nontender, nondistended. Positive bowel sounds x4. There is no hepatosplenomegaly.   EXTREMITIES: There is no clubbing, cyanosis, or edema.   SKIN: No rash.   LYMPHATICS: No lymph nodes palpable.   VASCULAR: Good DP, PT pulses.   PSYCHIATRIC: The patient is currently not anxious or depressed.   NEUROLOGICAL: The patient is currently sleepy, opens her eyes but is not fully able to cooperate. Was spontaneously moving all extremities prior to receiving the medications.   EVALUATIONS IN THE ED: Urinalysis shows nitrites negative, leukocytes negative. CT scan of the abdomen and pelvis shows severe distention of the urinary bladder concerning for bladder outlet obstruction. Abdominal and pelvis otherwise unchanged. Multi-chamber cardiac enlargement. No CT evidence of bowel obstruction. There is also a possible diffuse thickening of the stomach versus wall nondistention. Lactic acid 0.9. WBC 4.9, hemoglobin 10.7, platelet count 145. Lipase is 87. BMP glucose 99, BUN 15, creatinine 0.57, sodium 137, potassium 4.3, chloride 99, CO2 31, calcium 9.9, ALT 10, AST 17. Abdominal x-ray, three-view, shows no obstruction.   ASSESSMENT AND PLAN: The patient is an 81 year old white female who currently resides in an assisted living facility brought by her son due to abdominal pain. CT shows patient with urinary retention. Was given pain medication for her abdominal pain, now is drowsy. Had a Foley placed by the ED MD. Try to discharge the patient back to assisted living. The patient has some problems with vision. She has had issues with memory and agitation. Son feels that she  needs a higher level of care.  1. Decrease in responsiveness likely due to medications received in the ED. Hold sedating medications.  2. Urinary retention status post Foley. Will need outpatient Urology follow-up. Keep Foley in. 3. Hypertension, labile. Will continue treatment with doxazosin and lisinopril. Will place p.r.n. hydralazine.  4. Likely COPD/asthma. Continue p.r.n. MDI's.  5. Likely dementia, currently not on any treatment.  6. Anxiety. Resume alprazolam once awake.  7. Hyperthyroidism, on methimazole. Continue. Will check a TSH.  8. Likely diabetes. Continue metformin. Will place her on sliding scale insulin.  9. Disposition. Case manager evaluation for skilled nursing placement.  10. Miscellaneous. Lovenox for DVT prophylaxis.  TIME SPENT: 35 minutes spent.   ____________________________ Lacie Scotts. Allena Katz, MD shp:drc D: 08/25/2011 20:45:28 ET T: 08/26/2011 06:42:11 ET JOB#: [id number]  cc: Sakshi Sermons H. Allena Katz, MD, <Dictator> Burley Saver, MD Charise Carwin MD ELECTRONICALLY SIGNED 08/26/2011 13:15

## 2014-09-04 NOTE — Consult Note (Signed)
Brief Consult Note: Diagnosis: AF, probable not new, in setting of gastric ulcer, CHADS 2 3.   Patient was seen by consultant.   Consult note dictated.   Comments: REC  Agree with current therapy, uptutrate metoprolol, convert to Cardizem CD 120 mg QD, defer chronic anticoagulation secondary to gastric ulcer.  Electronic Signatures: Marcina MillardParaschos, Trammell Bowden (MD)  (Signed 01-Jun-13 09:37)  Authored: Brief Consult Note   Last Updated: 01-Jun-13 09:37 by Marcina MillardParaschos, Sundiata Ferrick (MD)

## 2014-09-04 NOTE — Discharge Summary (Signed)
PATIENT NAME:  Erica Carey, Erica Carey MR#:  [id number] DATE OF BIRTH:  1926-09-20  DATE OF ADMISSION:  08/25/2011 DATE OF DISCHARGE:  08/30/2011   CONSULTATION: Urology, Dr. Lonna CobbStoioff   DISCHARGE DIAGNOSES:  1. Hypertensive urgency.  2. Urinary retention and bladder outlet obstruction. 3. Altered mental status possibly due to narcotics.  CONDITION: Stable.   MEDICATIONS: 1. Metformin 1000 mg p.o. once daily extended-release. 2. Clonidine 0.1 mg p.o. daily.  3. Potassium 20 mEq p.o. once daily.  4. Norvasc/atorvastatin 10/20 mg p.o. daily.  5. Lasix 40 mg 1 tablet in the morning, half tablet at noon.  6. Lopressor 100 mg p.o. b.i.d.  7. Lisinopril 40 mg p.o. b.i.d.  8. Hyoscyamine 0.125 mg p.o. every six hours p.r.n. for abdominal cramps. 9. Symbicort 160 mcg/4.5 mcg inhalation 1 puff b.i.d.  10. Lidoderm 5% topical film apply one patch topically to each area no more than 12 hours of each 24. May apply a max of three patches to different areas each 24 hours.  11. Norco 325 mg/5 mg p.o. 2 tablets q.4 hours p.r.n.  12. Allopurinol 300 mg p.o. once daily.  13. Vitamin C 500 mg p.o. daily.  14. Docusate sodium 100 mg p.o. b.i.d.  15. Methimazole 10 mg p.o. once daily.  16. Omeprazole 40 mg p.o. daily.  17. Loratadine 10 mg p.o. once daily.  18. Magnesium oxide 400 mg p.o. b.i.d.  19. Doxazosin 8 mg p.o. once daily.  20. Tylenol 650 mg p.o. q.4 hours p.r.n.  21. ProAir HFA 90 mcg inhalation 2 puffs 4 times a day. 22. Alprazolam 0.25 mg p.o. t.i.d. p.r.n.   TREATMENT INSTRUCTIONS: The patient will need to keep Foley due to urine retention recommended by Dr. Lonna CobbStoioff.   DIET: Low sodium ADA 1800 calorie diet.   ACTIVITY: As tolerated.   FOLLOW-UP CARE:  1. Follow-up with PCP within 1 to 2 weeks. 2. Follow-up with urologist, to Dr. Lonna CobbStoioff, within one week.   CODE STATUS: DO NOT RESUSCITATE.   PRIMARY CARE PHYSICIAN: Dr. Quillian QuinceBliss   HISTORY: The patient is an 81 year old Caucasian female  with multiple medical problems including hypertension, anxiety, breast cancer, COPD, asthma, hypothyroidism, and diabetes who presented to the ED with abdominal pain, urinary retention, and generalized weakness. The patient was given morphine two doses for pain and had a CAT scan of abdomen which showed a significant amount of urinary retention. The patient had a Foley placed and had a good amount of urine. The patient's blood pressure was also noted to be elevated at 227/97. The plan was for her to get a Foley at discharge, however, there was concern with the family that she needed more help than she was getting at the assisted living and may need a higher level of care. For detailed history and physical examination, please refer to the admission note dictated by Dr. Auburn BilberryShreyang Patel.   LABORATORY, DIAGNOSTIC, AND RADIOLOGICAL DATA: Urinalysis negative. Leukocytes negative. CAT scan of abdomen showed severe distention of the urinary bladder concerning for bladder outlet obstruction but no bowel obstruction. WBC 4.9, hemoglobin 10.7, lipase 87, glucose 99, BUN 15, creatinine 0.57. Abdominal x-ray no obstruction.   HOSPITAL COURSE: The patient was admitted for decreased responsiveness likely due to medication received in the ED and also urinary retention status post Foley. Urology consult was requested. Dr. Lonna CobbStoioff evaluated the patient on the floor and he recommended keeping the Foley for 10 days and follow him up as outpatient. Since the patient had an elevated blood pressure, the  patient has been treated with hypertension medication including lisinopril, Lopressor, hydralazine. The patient's blood pressure is getting better. The patient has COPD which is stable, treated with nebulizer p.r.n.   VITAL SIGNS TODAY: Temperature 98, blood pressure 127/63, pulse 67, oxygen saturation 95% on room air.   PHYSICAL EXAMINATION: Essentially unremarkable. The patient is clinically stable. She will be discharged to a  nursing home today.   I discussed the patient's situation and the discharge plan with the patient, case manager, and the nurse.   TIME SPENT: About 40 minutes.   ____________________________ Shaune Pollack, MD qc:drc D: 08/30/2011 12:28:41 ET T: 08/30/2011 13:02:58 ET JOB#: [id number]  cc: Shaune Pollack, MD, <Dictator> Burley Saver, MD Verna Czech. Lonna Cobb, MD Shaune Pollack MD ELECTRONICALLY SIGNED 09/03/2011 6:27

## 2014-09-04 NOTE — Consult Note (Signed)
Pt abd feeling better, was able to eat today.  Her bladder is not    functioning, had scan of 480cc urine, will have nurse put foley back in.   Electronic Signatures: Scot JunElliott, Robert T (MD)  (Signed on 30-May-13 18:31)  Authored  Last Updated: 30-May-13 18:31 by Scot JunElliott, Robert T (MD)

## 2014-09-04 NOTE — Consult Note (Signed)
Brief Consult Note: Diagnosis: Urinary retention.   Patient was seen by consultant.   Comments: Admitted 4/14 with generalized weakness, abdominal pain and urinary retention.  She denies prior history of urologic problems.  CT was reviewed and there was marked distention of the bladder without hydronephrosis or hydroureter.  Urine output had been averaging 1500 mL per day.  Recommendation: Would leave catheter indwelling 10 days.  Office followup for voiding trial.  If she has persistent retention will need cystoscopy and potentially a urodynamic study.  Electronic Signatures: Riki AltesStoioff, Scott C (MD)  (Signed 16-Apr-13 17:44)  Authored: Brief Consult Note   Last Updated: 16-Apr-13 17:44 by Riki AltesStoioff, Scott C (MD)

## 2014-09-04 NOTE — Consult Note (Signed)
PATIENT NAME:  Erica Carey, Erica Carey MR#:  [id number] DATE OF BIRTH:  08/29/1926  DATE OF CONSULTATION:  10/08/2011  REFERRING PHYSICIAN:   CONSULTING PHYSICIAN:  Adah Salvageichard E. Excell Seltzerooper, MD  CHIEF COMPLAINT: Abdominal pain.   HISTORY OF PRESENT ILLNESS: This is a patient who has been seen multiple times for cystitis who complains of some abdominal pain. A CT scan suggested gastric outlet obstruction but the patient has had no nausea or vomiting and her chief complaint is lower abdominal pain. She has had some bloating but has had normal bowel movements. She has not had any fevers or chills. Has had no hematuria. Recent admission for cholecystitis as well.   PAST MEDICAL HISTORY:  1. Cystitis.  2. Hypertension.  3. Diabetes.  4. Diverticulosis. 5. Chronic obstructive pulmonary disease. 6. Reflux disease.   PAST SURGICAL HISTORY:  1. Eye surgery.  2. Knee surgery. 3. Breast biopsy.  Denies abdominal surgery.   MEDICATIONS: Multiple, see chart.   ALLERGIES: Contrast dye.   SOCIAL HISTORY: The patient does not smoke tobacco. [place]   FAMILY HISTORY: Noncontributory.   REVIEW OF SYSTEMS: A 10 system review was performed and negative with the exception of that mentioned in the history of present illness.   PHYSICAL EXAMINATION:   GENERAL: Elderly female patient.  VITAL SIGNS: Temperature 98.2, pulse 78, respirations 20, blood pressure 112/55, pain scale 5, 95% room air sat.   HEENT: No scleral icterus. Poor dentition.   NECK: No palpable neck nodes.   CHEST: Clear to auscultation.   CARDIAC: Regular rate and rhythm.   ABDOMEN: Soft, minimally distended, minimally tympanitic, nontender. No guarding. No rebound. No percussion tenderness.   EXTREMITIES: Moderate edema.   NEUROLOGIC: Grossly intact.   INTEGUMENTARY: No jaundice.   LABORATORY, DIAGNOSTIC, AND RADIOLOGICAL DATA: CT scan is personally reviewed and while there is a large gastric bubble there is  no sign of gastric outlet mass or peptic ulcer scarring to suggest a cause for gastric outlet obstruction. There are signs of cystitis   White blood cell count 7.1, hemoglobin and hematocrit 9.9 and 29.8. Electrolytes were all within normal limits. Sodium slightly depressed at 135. Albumin 3.3.   ASSESSMENT AND PLAN: I was asked to see the patient for possible gastric outlet obstruction but the patient gives no symptoms for that. She has no vomiting, no nausea. On CT scan not only does she have no mass to suggest a cause for gastric outlet obstruction, she has gas throughout her small bowel and colon and is having stools. I see no sign of a gastric outlet obstruction. I suspect that what we are seeing is gastroparesis in a diabetic elderly patient. I will be happy to follow the patient while she is in the hospital but I doubt that surgical intervention will be required.   ____________________________ Adah Salvageichard E. Excell Seltzerooper, MD rec:drc D: 10/08/2011 17:21:03 ET T: 10/09/2011 10:21:25 ET JOB#: [id number]  cc: Adah Salvageichard E. Excell Seltzerooper, MD, <Dictator> Lattie HawICHARD E Marcella Charlson MD ELECTRONICALLY SIGNED 10/10/2011 8:00

## 2014-09-04 NOTE — Discharge Summary (Signed)
PATIENT NAME:  Erica Carey, Erica Carey MR#:  [id number] DATE OF BIRTH:  1926-12-18  DATE OF ADMISSION:  10/08/2011 DATE OF DISCHARGE:  10/14/2011  PRIMARY CARE PHYSICIAN: Clayborn Bigness, MD  GASTROENTEROLOGIST: Lynnae Prude, MD  DISCHARGE DIAGNOSES:  1. Gastritis with large superficial gastric ulcer causing her to have abdominal pain, found on esophagogastroduodenoscopy, on Protonix and Carafate, improving. To avoid nonsteroidal anti-inflammatory medication. Tolerating diet.  2. Proteus urinary tract infection, treated. Needs one more day of antibiotics with Augmentin orally.  3. New onset atrial fibrillation with rapid ventricular rate, heart rate much better controlled, is in the 60s to 70s. On calcium channel blocker and beta blockers. Had a normal echo before. Cannot anticoagulate secondary to recent gastric ulcer, per cardiology.  4. Hypertension. Controlled on metoprolol and Cardizem.  5. Osteoarthritis. On Lidoderm patch and tramadol as needed. Avoid hydrocodone with her narcotic abuse history and known nonsteroidals with her recent gastric ulcer.  6. Neuropathic pain of the left leg, on Neurontin.  7. Anxiety/dementia, on Ativan and started on low-dose Risperdal.  8. Hyperthyroidism, on methimazole. Tachycardia could be due to hyperthyroidism, but TSH is was within normal limits, started on higher dose of beta blocker and will require outpatient endocrinology follow-up.  9. Chronic obstructive pulmonary disease, on 2 liters oxygen at home.   SECONDARY DIAGNOSES:  1. Hypertension. 2. Diabetes. 3. Hypothyroidism. 4. Rheumatoid osteoarthritis.  5. Anxiety. 6. Diverticulosis.  7. Chronic obstructive pulmonary disease. 8. Gastroesophageal reflux disease.   CONSULTATIONS:  1. Lynnae Prude, MD - Gastroenterology.  2. Physical Therapy.  3. Marcina Millard, MD - Cardiology.  PROCEDURES/RADIOLOGY: Endoscopy on 10/09/2011 by Dr. Mechele Collin showed severe diffuse gastritis and large  superficial ulcer in the proximal antrum.  CT scan of the abdomen and pelvis without contrast on 10/08/2011 showed possible cystitis, indeterminate 4 mm nodule in the right lower lobe of the lung.   KUB on 10/08/2011 showed moderately distended stomach with air.   KUB on 10/09/2011 showed no acute changes. No bowel obstruction.   MAJOR LABORATORY PANEL: Urinalysis on admission showed 65 WBCs, 3+ leukocyte esterase, and 1+ bacteria.  Urine culture grew more than 100,000 colonies of Proteus mirabilis.   Gastric biopsy was negative for H. pylori. No active inflammation.   HISTORY AND SHORT HOSPITAL COURSE: The patient is an 81 year old female with the above-mentioned medical problems who was admitted for abdominal pain and gastric distention. Please see Dr. Thomasena Edis dictated history and physical for further details. Surgical consultation was obtained with Dr. Excell Seltzer who recommended KUB, which was performed, along with CT, which did not show any obvious pathology. Gastroenterology consultation was recommended by him, which was obtained with Dr. Mechele Collin who recommended endoscopy, which was performed on 10/09/2011 with results dictated above showing severe gastritis and large superficial which could be due to nonsteroid anti-inflammatory medication. The patient was started on Protonix and Carafate. She was slowly improving. She was evaluated by physical therapy and was recommended short-term rehab. The patient was slowly advanced in her diet. She was found to be in rapid atrial fibrillation On 10/11/2011 for which cardiology consultation was obtained with Dr. Darrold Junker. She started on Cardizem and her metoprolol. She felt not to be a candidate for anticoagulation considering her recent gastric ulcer diagnosis. Her heart rate was much better controlled with Cardizem and metoprolol and she was stable enough to be discharged to a skilled nursing facility on 10/14/2011.   PHYSICAL EXAMINATION:    VITALS: On the date of discharge, her temperature was 98, heart  rate 87 per minute, respirations 20 per minute, blood pressure 133/82 mmHg, and she was saturating 98% on 2 liters of oxygen via nasal cannula.   CARDIOVASCULAR: Irregular, irregular heart rate.  3/6 systolic ejection murmur in the aortic area. No rales or gallops.  LUNGS: Clear to auscultation bilaterally. No wheezing, rales, rhonchi, or crepitation.   ABDOMEN: Soft and benign.   NEUROLOGIC: Nonfocal examination, globally weak. All other physical examination remained at baseline.   DISCHARGE MEDICATIONS:  1. Amlodipine 10 mg p.o. daily.  2. Atorvastatin 20 mg p.o. daily.  3. Colace 100 mg p.o. twice a day. 4. Doxazosin 8 mg p.o. daily. 5. Lidoderm 5% topical patch twice daily.  6. Loratadine 10 mg p.o. daily. 7. Magnesium oxide 400 mg p.o. twice a day. 8. Metformin 1000 mg p.o. daily. 9. Omeprazole 40 mg p.o. daily. 10. ProAir 2 puffs inhaled four times daily. 11. Symbicort 1 puff twice a day.  12. Vitamin C 1 tablet p.o. daily. 13. Allopurinol 300 mg p.o. daily.  14. Metoprolol 100 mg p.o. twice a day. 15. Methimazole 10 mg p.o. twice a day.  16. Lasix 20 mg p.o. daily as needed.  17. Neurontin 100 mg p.o. three times daily. 18. Tramadol 50 mg p.o. every six hours as needed.  19. Augmentin 875 mg p.o. twice a day for one more day with the last dose 10/15/2011.  20. Cardizem 240 mg p.o. daily.  21. Carafate 1 gram p.o. three times daily.  22. Risperdal 0.5 mg p.o. twice a day as needed.   DISCHARGE DIET: Low sodium, 1800 ADA.   DISCHARGE ACTIVITY: As tolerated.   DISCHARGE INSTRUCTIONS AND FOLLOW-UP: The patient was instructed not to take any kind of nonsteroidal anti-inflammatory medication unless instructed by a physician. She will need 2 liters oxygen via nasal cannula continuous and mainly at night and on exertion. She will need follow-up with her primary care physician, Dr. Clayborn BignessKatherine Bliss, in 1 to 2  weeks. She will need follow-up with Dr. Mechele CollinElliott from gastroenterology in 2 to 4 weeks and endocrinology, Dr. Tedd SiasSolum, in 2 to 4 weeks. He will require physical therapy evaluation and management while at the facility.   TOTAL TIME DISCHARGING THIS PATIENT: 55 minutes. ____________________________ Ellamae SiaVipul S. Sherryll BurgerShah, MD vss:slb D: 10/14/2011 10:28:23 ET T: 10/14/2011 10:51:41 ET JOB#: [id number]  cc: Paulyne Mooty S. Sherryll BurgerShah, MD, <Dictator> Burley SaverL. Katherine Bliss, MD Adah Salvageichard E. Excell Seltzerooper, MD Scot Junobert T. Elliott, MD A. Wendall MolaMelissa Solum, MD Marcina MillardAlexander Paraschos, MD Patricia PesaVIPUL S Madalyne Husk MD ELECTRONICALLY SIGNED 10/14/2011 22:14

## 2014-09-04 NOTE — Consult Note (Signed)
PATIENT NAME:  Erica Carey, Erica Carey MR#:  [id number] DATE OF BIRTH:  Dec 03, 1926  DATE OF CONSULTATION:  10/12/2011  REFERRING PHYSICIAN:  Dr. Nemiah Commander  CONSULTING PHYSICIAN:  Marcina Millard, MD  PRIMARY CARE PHYSICIAN: Dr. Clayborn Bigness   CHIEF COMPLAINT: Abdominal pain.   HISTORY OF PRESENT ILLNESS: The patient is an 81 year old female referred for evaluation of new onset atrial fibrillation. The patient was admitted on 10/08/2011 with abdominal pain and leg cramps. During her hospitalization she has undergone GI work-up which has revealed gastric ulcer and gastritis. During her hospitalization patient has developed intermittent atrial fibrillation. According to the patient, patient has had a history of atrial fibrillation in the past. In addition, she has had a history of deep vein thrombosis.   PAST MEDICAL HISTORY:  1. Apparent history of tachycardia, intermittent atrial fibrillation.  2. Hypertension.  3. Diabetes.  4. Chronic obstructive pulmonary disease.  5. Gastroesophageal reflux disease.  6. Questionable history of deep vein thrombosis.  7. Hypothyroidism.  8. Diverticulitis.  9. Hypothyroidism.   MEDICATIONS: 1. Norvasc 10 mg daily.  2. Clonidine 0.1 mg daily.  3. Doxazosin 8 mg daily.  4. Lasix 40 mg q.a.m., 20 mg q.p.m.  5. Lisinopril 40 mg daily.  6. Metoprolol 100 mg b.i.d.  7. Norco 1 to 2 q.4 p.r.n.  8. Allopurinol 300 mg daily.  9. Atorvastatin 200 mg daily.  10. Colace 100 mg b.i.d.  11. Lidoderm 5% patch. 12. Loratadine 10 mg daily.  13. Magnesium oxide 400 mg b.i.d.  14. Metformin 1000 mg daily.  15. Methimazole 10 mg daily.  16. Omeprazole 40 mg daily.  17. Potassium chloride 20 mEq daily.  18. ProAir inhaler 2 puffs q.i.d.  19. Symbicort 160/4.5, 1 puff b.i.d.   SOCIAL HISTORY: Patient currently resides at assisted living. [place] from [place]. She denies tobacco abuse.   FAMILY HISTORY: Father status post myocardial  infarction apparently in his 35s.    REVIEW OF SYSTEMS: CONSTITUTIONAL: No fever or chills. EYES: No blurry vision. EARS: No hearing loss. RESPIRATORY: No shortness of breath. CARDIOVASCULAR: Intermittent palpitations. GASTROINTESTINAL: Patient did present with abdominal discomfort, currently denies nausea, vomiting, diarrhea. GENITOURINARY: No dysuria, hematuria. ENDOCRINE: No polyuria or polydipsia. HEMATOLOGICAL: No easy bruising or bleeding. MUSCULOSKELETAL: Patient does have arthralgias. NEUROLOGICAL: Patient denies focal muscle weakness or numbness. PSYCHOLOGICAL: No depression or anxiety.   PHYSICAL EXAMINATION:  VITAL SIGNS: Blood pressure 145/79, pulse 81, respirations 16, temperature 98.4, pulse oximetry 92%.   HEENT: Pupils equal, reactive to light and accommodation.   NECK: Supple without thyromegaly.   LUNGS: Clear.   CARDIOVASCULAR: Normal jugular venous pressure. Normal PMI. Regular, regular rhythm. Normal S1, S2. No appreciable gallop, murmur, rub.   ABDOMEN: Soft and nontender. Pulses were intact bilaterally.   MUSCULOSKELETAL: Normal muscle tone.   NEUROLOGIC: Patient is alert and oriented x3. Motor and sensory are both grossly intact.   IMPRESSION: 81 year old female who presents with abdominal pain, found to have gastritis and gastric ulcer, has had intermittent atrial fibrillation with rate under reasonable control. I suspect atrial fibrillation was initially exacerbated by holding metoprolol which was previously taken at 100 mg b.i.d.   RECOMMENDATIONS:  1. Continue metoprolol, up titrate as needed. 2. Continue Cardizem for now and up titrate as needed.  3. Would defer chronic anticoagulation in light of gastritis and gastric ulcer unless GI feels the patient would be at low risk for bleed. Patient has a CHADS2 score of 3 with prior history of deep vein thrombosis.  4. Consider 2-D echocardiogram if not previously performed. 5. Will continue to follow up as  outpatient.  ____________________________ Marcina MillardAlexander Sherica Paternostro, MD ap:cms D: 10/12/2011 09:34:32 ET T: 10/12/2011 11:05:25 ET JOB#: [id number]  cc: Marcina MillardAlexander Corazon Nickolas, MD, <Dictator> Marcina MillardALEXANDER Latesha Chesney MD ELECTRONICALLY SIGNED 10/24/2011 17:41

## 2014-09-04 NOTE — H&P (Signed)
PATIENT NAME:  Erica Carey, Erica Carey MR#:  [id number] DATE OF BIRTH:  [birth year]  DATE OF ADMISSION:  10/08/2011  ADMITTING PHYSICIAN: Enid Baasadhika Tranika Scholler, MD.  PRIMARY CARE PHYSICIAN: Clayborn BignessKatherine Bliss, MD.   CHIEF COMPLAINT: Abdominal pain and leg cramps.   HISTORY OF PRESENT ILLNESS: Ms. Erica Carey is an 81 year old Caucasian female who has recently moved to live [place] from [place] in the past, who was also admitted last month in the hospital for urinary retention and was discharged to rehab. Today, she comes from assisted living facility with the above-mentioned complaints. The patient says after her discharge last month, she went to rehab. Her Foley was taken out. She was doing fine with urination. She has not been feeling well since yesterday. She said she felt bloated in her belly. She did have a bowel movement which was normal. No nausea or vomiting, but did have abdominal pain. Her legs got cramping. She felt like she was retaining fluid in her legs, could not take it anymore so had to come to the hospital this morning. She did not have any fevers or chills. No diarrhea, vomiting or nausea at this time. She was found to have urinary tract infection on her labs and also she had gastric distention which is seen on CT for which she is being admitted. Surgery has already seen the patient while in the Emergency Room and said that no acute surgical condition because there is no gastric outlet obstruction. The patient has refused NG tube in the ER.   PAST MEDICAL HISTORY:  1. Hypertension.  2. Non-insulin-dependent diabetes mellitus.  3. Hypothyroidism.  4. Rheumatoid and osteoarthritis, on pain medications.  5. History of anxiety.  6. Diverticulosis.  7. Chronic obstructive pulmonary disease/asthma, on 2 liters home oxygen.  8. Hypothyroidism. 9. Gastroesophageal reflux disease.   PAST SURGICAL HISTORY:  1. Lens implants in both eyes. 2. Bilateral knee surgery. 3. Lumpectomy from breast,  which was benign mass.   ALLERGIES: Seafood and contrast dye.   CURRENT HOME MEDICATIONS:  1. Norco 5/325 mg 1 to 2 tablets every four hours p.r.n. She takes about 6 tablets every day.  2. Allopurinol 300 mg p.o. daily.  3. Norvasc 10 mg p.o. daily.  4. Atorvastatin 20 mg p.o. daily.  5. Clonidine 0.1 mg p.o. daily.  6. Colace 100 mg p.o. b.i.d.  7. Doxazosin 8 milligrams p.o. daily.  8. Lasix 40 mg in the morning and 20 mg in the afternoon.  9. Lidoderm 5% patch, one patch topically to painful area twice daily.  10. Lisinopril 40 mg p.o. b.i.d.  11. Loratadine 10 mg p.o. daily.  12. Magnesium oxide 400 mg p.o. b.i.d.  13. Metformin 1000 mg p.o. daily.  14. Methimazole 10 mg p.o. daily.  15. Metoprolol 100 mg p.o. b.i.d.  16. Omeprazole 40 mg p.o. daily.  17. Potassium chloride 20 mEq p.o. daily.  18. ProAir inhaler 2 puffs 4 times daily.  19. Symbicort 160/4.5, one puff b.i.d.  20. Vitamin C 1 capsule p.o. daily.   SOCIAL HISTORY: Currently from assisted living facility, recently moved from [place] 1 [place] 2 months ago. No history of any smoking or alcohol, but has been exposed to passive smoking from husband.   FAMILY HISTORY: Mother died young from childbirth complications and father died in his 30s from heart disease.   REVIEW OF SYSTEMS: CONSTITUTIONAL: No fever, fatigue, or weakness. EYES: Legally blind in both eyes. Prior history of cataract surgery. ENT: Mild left earache, which is  chronic and also decreased hearing in that ear. No tinnitus, ear pain, discharge, or snoring. RESPIRATORY: No cough, wheeze, or hemoptysis. Positive for asthma. CARDIOVASCULAR: No chest pain, orthopnea, or edema. Positive for irregular heart beat paroxysmally, possible paroxysmal atrial fibrillation. GASTROINTESTINAL: No nausea, vomiting, or diarrhea. Positive for abdominal pain. No hematemesis or melena. GENITOURINARY: Positive for dysuria. No hematuria, renal calculus, or frequency. ENDOCRINE:  No polyuria, nocturia, thyroid problems, heat or cold intolerance. HEMATOLOGY: No anemia, easy bruising or bleeding. SKIN: No acne, rash, or lesions. MUSCULOSKELETAL: Positive for joint pain. Positive for arthritis and gout. NEUROLOGIC: No numbness, weakness, cerebrovascular accident, transient ischemic attack, or seizures. PSYCHOLOGICAL: No anxiety, insomnia, or depression.   PHYSICAL EXAMINATION:  VITAL SIGNS: Temperature 98.2 degrees Fahrenheit, pulse 68, respirations 22, blood pressure 120/55, and pulse oximetry 94% on room air.   GENERAL: Well built, well nourished female lying in bed, not in any acute distress.   HEENT: Normocephalic, atraumatic. Pupils are unequal. Right pupil is 2 millimeters, sluggish reaction to light. Left pupil is post surgical dilated, irregular with minimal reaction to light. Anicteric sclerae. Extraocular movements intact. Oropharynx clear without erythema, mass or exudates.   NECK: Supple. No thyromegaly, JVD, or carotid bruits. No lymphadenopathy.   LUNGS: Moving air bilaterally. Decreased bibasilar breath sounds. No wheeze or crackles. No use of accessory muscles for breathing.   CARDIOVASCULAR: S1, S2, regular rate and rhythm. 3/6 systolic murmur in the aortic area. No rubs or gallops.   ABDOMEN: Soft, hyperactive bowel sounds. No tenderness. No guarding. No rigidity. No hepatosplenomegaly on my exam.   EXTREMITIES: No pedal edema. No clubbing or cyanosis. 2+ dorsalis pedis pulses palpable bilaterally.   SKIN: No acne, rash, or lesions.   LYMPHATICS: No cervical lymphadenopathy.   NEUROLOGIC: Cranial nerves intact. No focal motor or sensory deficits.   PSYCHOLOGICAL: The patient is awake, alert, oriented x3.   LABORATORY, RADIOLOGICAL AND DIAGNOSTIC DATA: WBC 7.1, hemoglobin 9.9, hematocrit 29.8, platelet count 147. Sodium 135, potassium 4.6, chloride 99, bicarbonate 27, BUN 25, creatinine 0.83, glucose 103, calcium 9.5. ALT 12, AST 18, alkaline  phosphatase 85, total bilirubin 0.3, albumin 3.3. Lipase 109. Urinalysis with 3+ leukocyte esterase, 65 WBCs, and 1+ bacteria. KUB showing moderately dilated stomach. CT of the abdomen and pelvis showing moderately distended stomach. Correlate for gastric obstruction or gastroparesis. The common bile duct is mildly enlarged. Further evaluation could be provided with right upper quadrant ultrasound or M.R.C.P. as indicated. Mild stranding around bladder, possibility of cystitis is present. Indeterminate 4 millimeter right lower lobe nodule is present. Liver, spleen, pancreas and gallbladder within normal limits otherwise. EKG showing sinus arrhythmia with heart rate of 70. First degree AV block is present with elevated PR interval of 242 ms.   ASSESSMENT AND PLAN: This is an 81 year old female with a past medical history of diabetes, hypertension, admitted for gastric distention and also urinary tract infection.  1. Abdominal pain with gastric distention, either outlet obstruction versus gastroparesis. Appreciate surgical consult. No acute surgical indication as no gastric outlet obstruction seen on CT of the abdomen. We will admit. The patient refusing NG tube, so we will start Reglan 5 mg IV every six hours and get gastrointestinal consult. Repeat KUB in a.m. Keep her n.p.o. and IV fluids. Reason for gastroparesis, not sure if she is taking more pain medications for arthritis because her diabetes does not seem to be that worse. Will get hemoglobin A1c anyway.  2. Urinary tract infection. Follow-up urine cultures stat and start on  Rocephin.  3. Osteoarthritis and rheumatoid arthritis. Hold p.o. pain medications for now. Lidoderm patch and Toradol IV p.r.n.  4. Diabetes mellitus. Follow-up hemoglobin A1c. Since the patient is n.p.o., only sliding scale insulin and hold metformin.   5. Hyperthyroidism. Hold Methimazole for now and check TSH, T4 levels.  6. Hypertension. Hold p.o. medications and will start on  IV hydralazine p.r.n.  7. Gastrointestinal prophylaxis. On IV Protonix b.i.d.   CODE STATUS: FULL CODE.         TIME SPENT ON ADMISSION: 50 minutes.   ____________________________ Enid Baas, MD rk:ap D: 10/08/2011 09:12:46 ET T: 10/08/2011 11:04:33 ET JOB#: [id number]  cc: Enid Baas, MD, <Dictator> Burley Saver, MD Enid Baas MD ELECTRONICALLY SIGNED 10/14/2011 10:14

## 2014-09-04 NOTE — Consult Note (Signed)
Brief Consult Note: Diagnosis: cystitis.   Patient was seen by consultant.   Consult note dictated.   Recommend further assessment or treatment.   Discussed with Attending MD.   Comments: Doubt GOO as pt not vomiting and CT does not show PUD scarring or mass. Pt passing gas and not obstructed. Rec w/u with EGD but this may represent gastroparesis or rxn to cystitis.  Electronic Signatures: Lattie Hawooper, Kelon Easom E (MD)  (Signed [phone]-May-13 07:55)  Authored: Brief Consult Note   Last Updated: 28-May-13 07:55 by Lattie Hawooper, Graysen Woodyard E (MD)

## 2014-09-04 NOTE — Consult Note (Signed)
Pt stomach feeling much better on PPI and carafate before meals.  To go to nursing home when cardiac stable.  Recommend soft bland diet and continue meds for 2 months then stop carafate and continue PPI once a day.  Electronic Signatures: Scot JunElliott, Brayn Eckstein T (MD)  (Signed on 31-May-13 19:27)  Authored  Last Updated: 31-May-13 19:27 by Scot JunElliott, Sovereign Ramiro T (MD)

## 2014-09-04 NOTE — Consult Note (Signed)
Brief Consult Note: Diagnosis: Abdominal pain and leg cramps.   Patient was seen by consultant.   Consult note dictated.   Comments: Appreciate consult for [age] caucasian woman with history of bilateral TKR, htn, NIDDM, RA, COPD, hypothyroidism, ?TIA  2012, htn,  and GERD for gastric distension. Patient is currently very drowsy, falls asleep repeatedly during interview, and somewhat confused. I did talk with her son, Gabriel RungJoe, who states that she was hospitalized last month with urinary retention- [facility], and is now at [facility] assisted living with nurse visits. States that they have recently been encouraging her to be more active. States that patient had been fine, and with no complaints, until yesterday afternoon- patient called him with leg pain, and then later she called EMS, and started complaining of abdominal pain when the paramedics arrived. He states that up until now, she has had no Gi related complaints, with the exception of 2 episodes of loose stools since Sunday; reports that her appetite is good-and  they enjoyed a dinner at [employer] recently without problems. He does state concern regarding his mother's use of Vicodin- says she takes it for her arthralgias, but has recently had a dose reduction in relation to altered mental status. Patient did state she had a colonoscopy in the past, but unable to provide details: son unsure about this. Has not had cholecytectomy. On exam patient with umbilical and upper abdominal tenderness. On rectal exam, stool brown, heme negative.  Impression and plan: Upper abdominal discomfort. Agree with PPI therapy. In r/t gastric distension, will plan for EGD: may have a stress ulcer in relation to prior hospitalization, or this may be related to some gastroparesis due to medication side effects of vicodin, clonidine. Have discussed with Dr Markham JordanElliot. I did speak again with Joe, in relation to cardiac history- he states a history of htn, but no MI/  other cardiac illness. He now states that his when patient was hospitalized last yr in [place], that the doctors said she had a tumor, but is unable to provide details- states he will obtain this.  Electronic Signatures: Vevelyn PatLondon, Christiane H (NP)  (Signed [phone]-May-13 18:30)  Authored: Brief Consult Note   Last Updated: 28-May-13 18:30 by Keturah BarreLondon, Christiane H (NP)

## 2014-09-04 NOTE — Consult Note (Signed)
PATIENT NAME:  Erica Carey, Erica Carey MR#:  [id number] DATE OF BIRTH:  [birth year]  DATE OF CONSULTATION:  10/08/2011  REFERRING PHYSICIAN:  Enid Baasadhika Kalisetti, MD  CONSULTING PHYSICIAN:  Keturah Barrehristiane H. London, NP PRIMARY CARE PHYSICIAN: Clayborn BignessKatherine Bliss, MD   HISTORY OF PRESENT ILLNESS: [name] is an 81 year old Caucasian female who has a history of bilateral total knee replacements, hypertension, non-insulin-dependent diabetes mellitus, RA, chronic obstructive pulmonary disease, hypothyroidism, transient ischemic attack in 2012, and gastroesophageal reflux disease. GI has been consulted at the request of Dr. Nemiah CommanderKalisetti for evaluation of gastric distention. The patient is currently very drowsy, falls asleep repeatedly during the interview and is somewhat confused. I did talk with her son, Gabriel RungJoe, who states that she was hospitalized last month here at [facility] with urinary retention. She [facility], and now she resides at [facility] with nurse vitis.  She states that therapists and family have recently been encouraging  the patient to be more active. He states that she had been fine and with no complaints until yesterday afternoon.States the patient called him with leg pain and then later in the evening she called EMS and started complaining of abdominal pain when the paramedics arrived. He states that up until then, she  had no GI-related complaints, and with the exception of two episodes of loose stools since Sunday. He does report that her appetite is good. They enjoyed a dinner together at [employer] which she ate very well and had no problems with. There has been no nausea or vomiting, difficulty swallowing, or any such problem. He does state concern regarding his mother's use of Vicodin. She says she takes it for her arthralgias. She has recently had a dose reduction in relation to altered mental status. The patient did state that she had a colonoscopy in the past but  was unable to provide details. Her son is unsure about this. She has not undergone a cholecystectomy. Please refer to admission History and Physical for full admit details.   PAST MEDICAL HISTORY:  1. Hypertension. 2. Non-insulin-dependent diabetes mellitus. 3. Hypothyroidism. 4. Rheumatoid arthritis.  5. Osteoarthritis. 6. Anxiety. 7. Diverticulosis. 8. Chronic obstructive pulmonary disease, 2 liters home O2. 9. Hypothyroidism.  10. Gastroesophageal reflux.   PAST SURGICAL HISTORY:  1. Bilateral lens implants.  2. Bilateral knee surgery.  3. Breast lumpectomy which was benign.   ALLERGIES: Seafood and contrast dye.   CURRENT HOME MEDICATIONS:  1. Norco 5/325, 1 to 2 every 4 hours p.r.n.  2. Allopurinol 300 mg p.o. daily.  3. Norvasc 10 mg p.o. daily.  4. Atorvastatin 20 mg p.o. daily.  5. Clonidine 0.1 mg p.o. daily.  6. Colace 100 mg p.o. b.i.d.  7. Doxazosin 8 mg p.o. daily.  8. Lasix 40 mg p.o. in the a.m. and 20 mg p.o. in the p.m.  9. Lidoderm patch 5% topically to painful area b.i.d.  10. Lisinopril 40 mg p.o. b.i.d.  11. Loratadine 10 mg p.o. daily. 12. Magnesium oxide 400 mg p.o. b.i.d. 13. Metformin 1000 mg p.o. daily.  14. Methimazole 10 mg p.o. daily.  15. Metoprolol 100 mg p.o. b.i.d.  16. Omeprazole 40 mg p.o. daily.  17. Potassium chloride 20 mEq p.o. daily.  18. ProAir Inhaler 2 puffs q.i.d. 19. Symbicort 160/4.5, 1 puff b.i.d.  20. Vitamin C 1 capsule p.o. daily.   SOCIAL HISTORY: [place] recently moved from [place] approximately 1 to 2 months ago. No history of smoking, EtOH or illicits.   FAMILY  HISTORY: No known colorectal cancer, liver disease, gallbladder problems. Family history is significant for peptic ulcer disease.   REVIEW OF SYSTEMS: I was not able to obtain this from the patient due to her mental status; so, review is taken from the History and Physical. CONSTITUTIONAL: There was no fatigue, fever or weakness. HEENT: The patient has been  legally blind in both eyes. Prior history of cataract surgery. Mild chronic left earache. Otherwise, no tinnitus, ear pain or discharge. RESPIRATORY: Chronic obstructive pulmonary disease appears controlled by her current medication plan with no cough, wheeze, or hemoptysis. CARDIOVASCULAR: No history of myocardial infarction or cardiac disease. No history of chest pain or orthopnea. She does have history of leg edema. Evidently she may have had some irregular beats at one point. GASTROINTESTINAL: No nausea, vomiting, diarrhea, as noted above. GENITOURINARY: Recently admitted with urinary retention. She had a Foley placed. She was seen by Dr. Lonna Cobb as an outpatient and it was taken out, reinserted last night in the ED. ENDOCRINE: She does have history of non-insulin-dependent diabetes mellitus, takes p.o. metformin. Son does report a history of a thyroid mass found when she was in [place]. This was not evaluated at the time. Her TSH is normal here. She does have a mild elevation of her free T4. She is taking methimazole. HEMATOLOGY: Discharge hemoglobin last month was 10.6; today it is 9.9. No easy bruising or bleeding. SKIN: No history of erythema, lesion or rash. MUSCULOSKELETAL: Positive for joint pain, arthritis and gout. NEUROLOGIC: Likely transient ischemic attack last autumn in 2012 in [place]. No history of seizure, numbness, tingling. PSYCHIATRIC: No history of anxiety, insomnia, or depression   LABORATORY, DIAGNOSTIC AND RADIOLOGICAL DATA:  Most recent labs: Glucose 103, BUN 25, creatinine 0.83, sodium 135, potassium 4.6, GFR greater than 60, phosphorus 3, magnesium 1.5, lipase 109.  A1c 6.1, total protein 6, albumin 3.3, total bilirubin 0.3, ALP 85, AST 18, ALT 12.  Cardiac enzymes and troponins have been negative x3.  Free T4 1.6. TSH 1.08.  WBC 7.5, hemoglobin 9.9, platelets 147. Red cells are normocytic. EKG revealed normal sinus rhythm with first-degree block with occasional PACs.   Urinalysis shows likely urinary tract infection. CT of abdomen and pelvis without contrast did demonstrate a 4 mm nodule in the right lower lobe and some atelectasis bilaterally. The liver, spleen, adrenals, gallbladder, and pancreas were unremarkable. There was some mild prominence of the intrahepatic biliary ducts with the common bile duct measuring 10 mm. Evidently this was similar to a previous exam. Moderate sized duodenal diverticulum. Some gastric distention with fluid and air. Large and small bowel are normal in caliber. No signs of appendicitis.  KUB did demonstrate some air in the stomach.   PHYSICAL EXAMINATION:  VITAL SIGNS: Most recent vital signs: Temperature 98.3, pulse 78, respiratory rate 18, blood pressure 128/66, oxygen saturation 96% on 2 liters oxygen.   GENERAL: Well-nourished Caucasian woman lying in bed in no acute distress.   HEENT: Normocephalic, atraumatic. No redness or drainage to nares or eyes. Oral mucous membranes are pink and moist.   NECK: Supple. No thyromegaly, JVD, or lymphadenopathy.   LUNGS: Respirations are eupneic. Lungs are clear, however, decreased somewhat bilaterally. No accessory  use of muscles for breathing.   CARDIOVASCULAR: S1, S2, regular rate and rhythm. A grade 3/6 systolic murmur noted in all auscultation points. No gallop.   ABDOMEN: Nondistended. Active bowel sounds. Tenderness noted around the umbilicus and upper quadrants. No guarding rigidity, hepatosplenomegaly, masses, or hernias. No rebound  tenderness.   EXTREMITIES: No pedal edema, clubbing or cyanosis, moves all extremities spontaneously.   SKIN: No erythema, lesion or rash.   NEUROLOGICAL: Cranial nerves II through XII appear intact; however, the patient is somewhat confused. She does respond to questions when prodded.   PSYCHIATRIC: Somewhat cooperative, agitates easily but does relieve with reassurance. Oriented to self and place, unable to provide details as to symptoms  and illness   IMPRESSION AND PLAN: Upper abdominal discomfort: Agree with PPI therapy. In relation to gastric distention, we will plan for EGD. She may have a stress ulcer in relation to prior hospitalization, or this may be related to some gastroparesis due to medication side effects of Vicodin and clonidine. I have discussed this with Dr. Mechele Collin. Additionally, I did speak again with Joe, her son, in relation to her cardiac history. He states she has a history of hypertension but no myocardial infarction or other cardiac illness. Additionally, at this time he did report that the patient has a mass on her thyroid, and  I will advise the Hospitalist Service of this in the event that further work needs to be done here.     These services were provided by Vevelyn Pat, MSN, NPC in collaboration with Scot Jun, MD with whom I have discussed this patient in full.   ____________________________ Keturah Barre, NP chl:cbb D: 10/08/2011 17:26:17 ET T: 10/08/2011 18:06:13 ET JOB#: [id number]  cc: Keturah Barre, NP, <Dictator> Eustaquio Maize LONDON FNP ELECTRONICALLY SIGNED 10/09/2011 7:08

## 2014-09-04 NOTE — Consult Note (Signed)
Severe diffuse gastritis, large superficial ulcer in proximal antrum. Bx done.  Must stop Toradol which is a potent NSAID and can affect stomach even given iv. Continue carafate and iv PPI, clear liquids and advance to full liquids as tol.  Await bx, if neg for H. pylori check blood test to be sure.  Electronic Signatures: Scot JunElliott, Secundino Ellithorpe T (MD)  (Signed on 29-May-13 12:31)  Authored  Last Updated: 29-May-13 12:31 by Scot JunElliott, Kerline Trahan T (MD)

## 2015-03-14 DEATH — deceased

## 2016-04-07 IMAGING — CT CT CHEST W/O CM
2 of 3 series · 15 of 36 positions shown, 18 images · non-contrast
Comparison: CT CHEST W/O CM dated 04/02/2013

CLINICAL DATA: Pt unable to raise Right arm. F/U CT chest 04/02/13
showing Stable small pulmonary nodules bilaterally. no hx CA, hx
Benign Left Breast lumpectomy due to clogged milk duct.

EXAM:
CT CHEST WITHOUT CONTRAST
TECHNIQUE: Multidetector CT imaging of the chest was performed following the
standard protocol without IV contrast.

[Series 2: soft tissue · axial · 0.69mm/px · z∈[-445,-215]mm · 12 of 54 slices shown, 15 images]
[im 4/54  mediastinal]
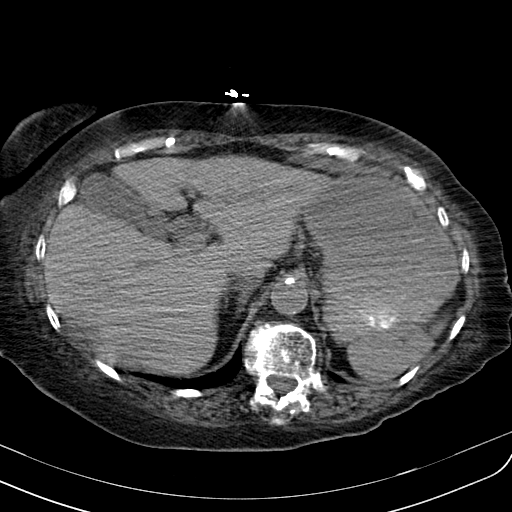
[im 4/54  lung]
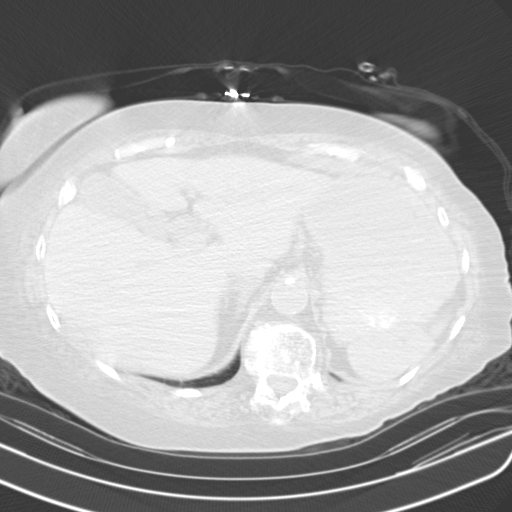
[im 8/54  lung]
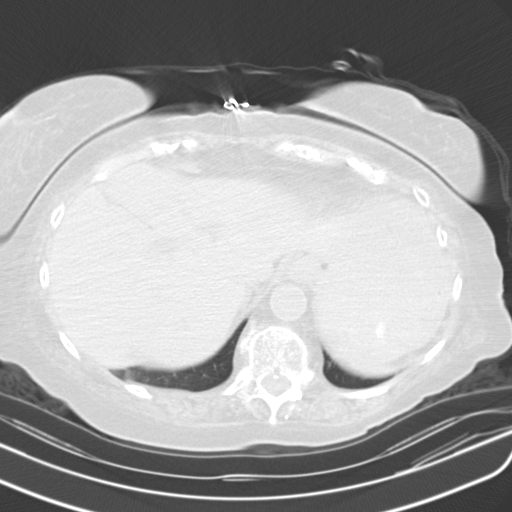
[im 12/54  lung]
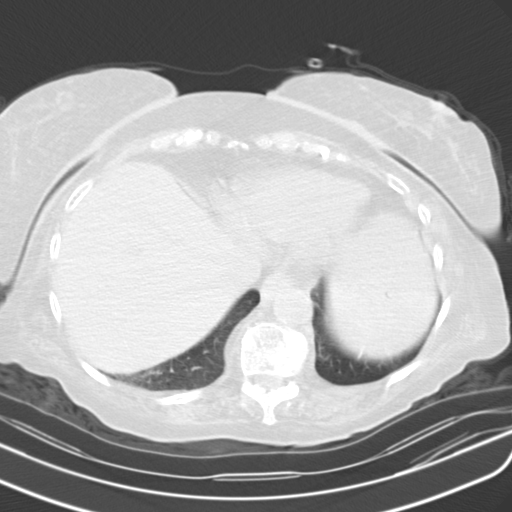
[im 16/54  lung]
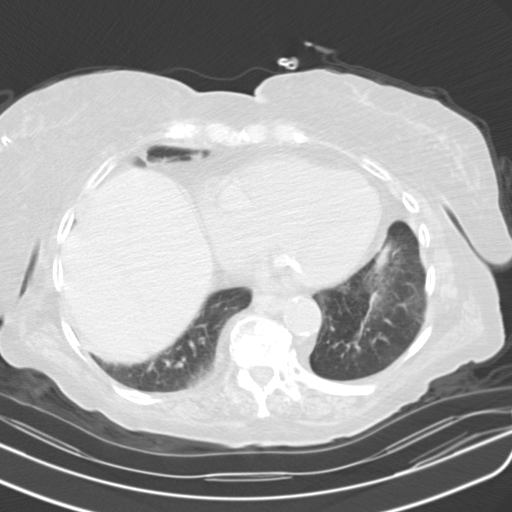
[im 20/54  mediastinal]
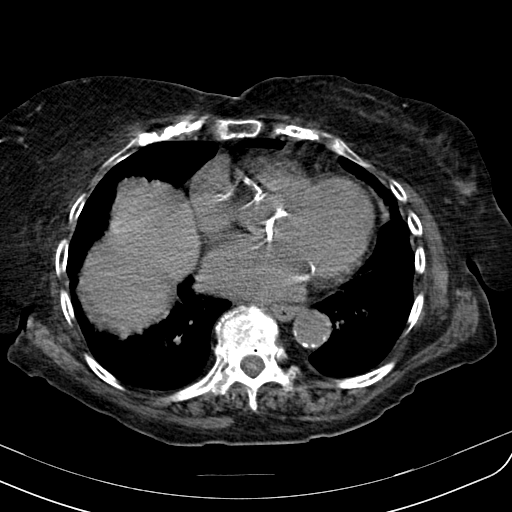
[im 20/54  lung]
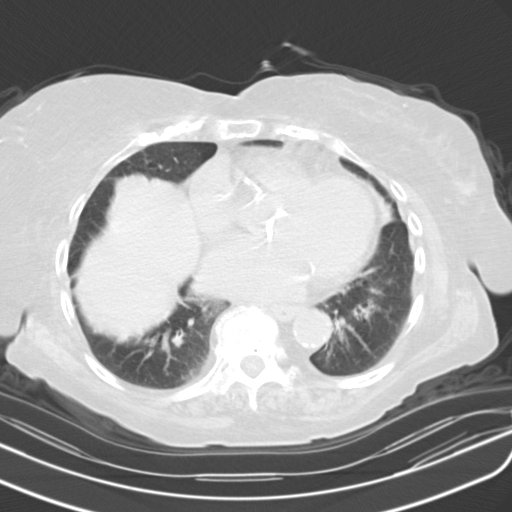
[im 24/54  lung]
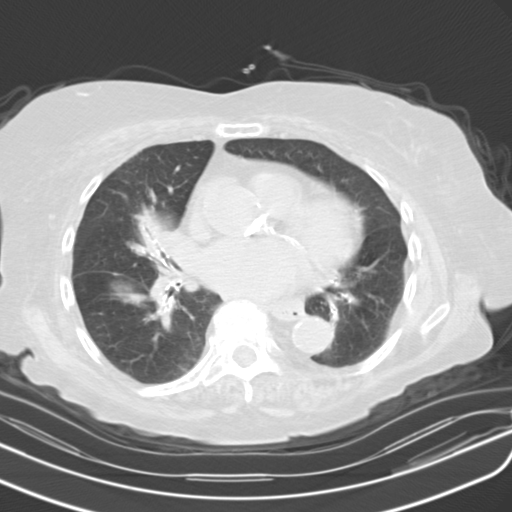
[im 30/54  lung]
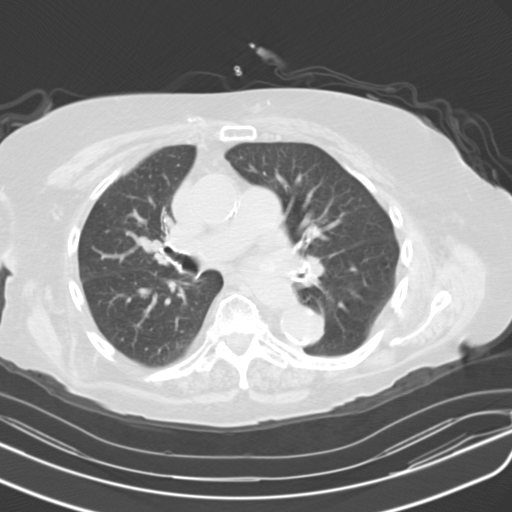
[im 34/54  lung]
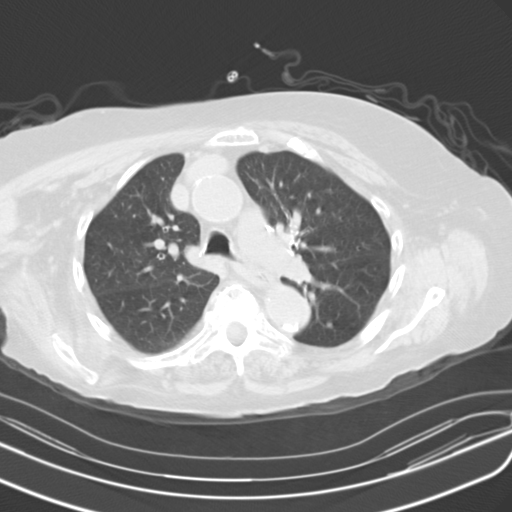
[im 38/54  mediastinal]
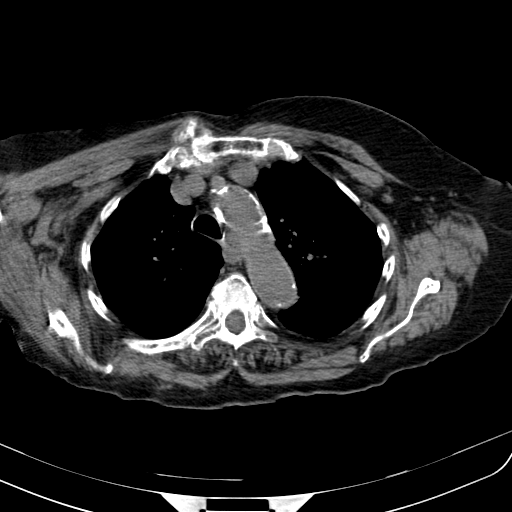
[im 38/54  lung]
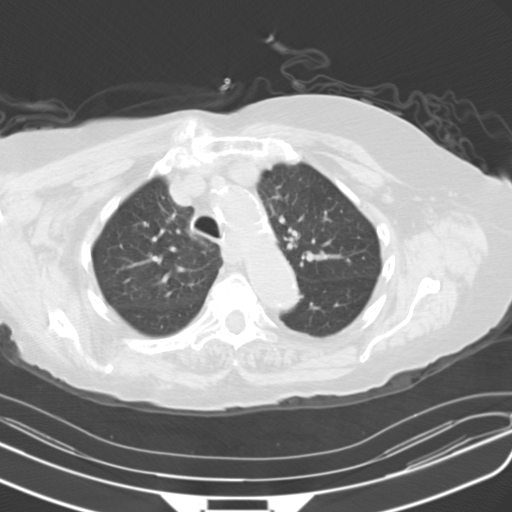
[im 42/54  lung]
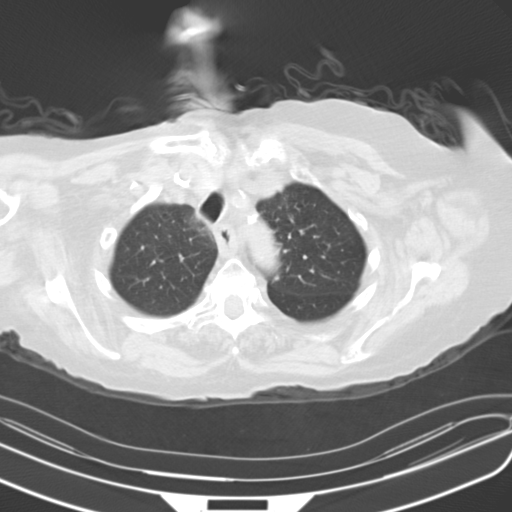
[im 46/54  lung]
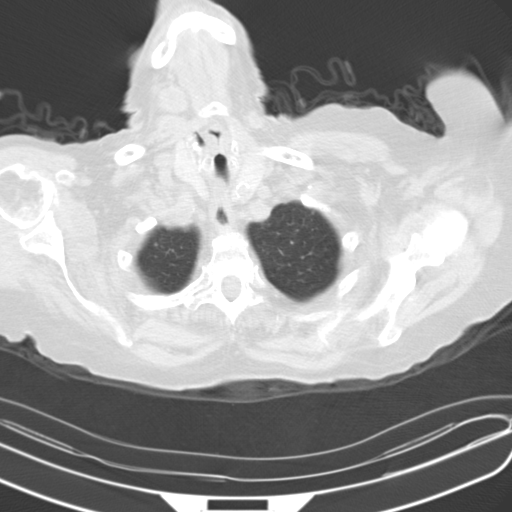
[im 50/54  lung]
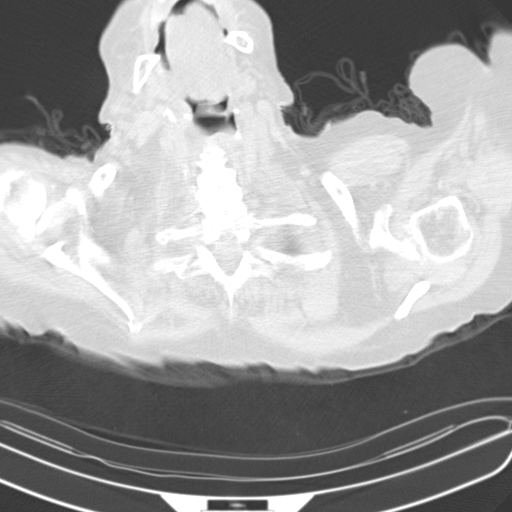

[Series 602: coronals · coronal · 0.69mm/px · 3 of 125 slices shown]
[im 25/125  lung]
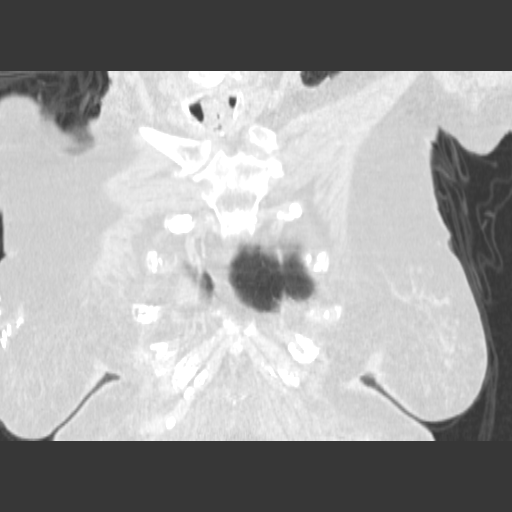
[im 50/125  lung]
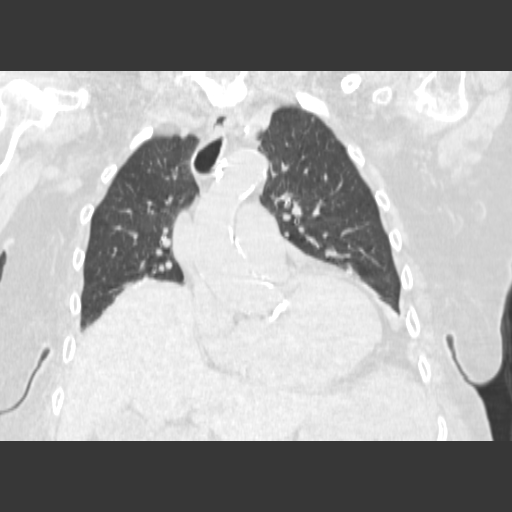
[im 75/125  lung]
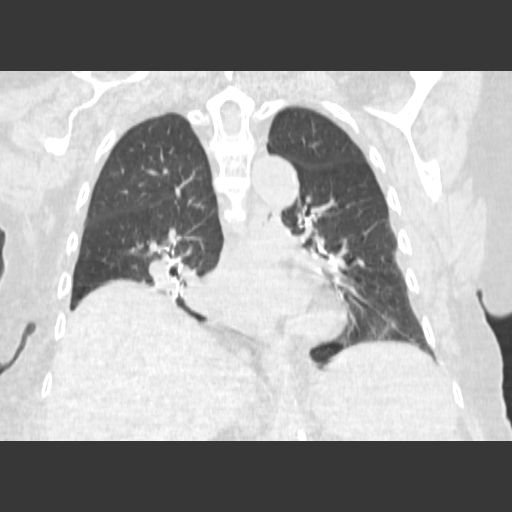

[15 of 36 positions shown; findings below may reference images not displayed]

FINDINGS: Stable partially calcified nodule within the left lobe of thyroid.
Thoracic inlet otherwise unremarkable.

Atherosclerotic calcifications are appreciated within the aorta and
coronary vessels. The heart enlarged. No mediastinal masses nor
adenopathy appreciated. No thoracic aortic aneurysm appreciated.

No acute pulmonary abnormalities.

- the nodules within the left lung apex image 10 series 3 are
stable, anterior nodule measuring 4 mm, posterior nodule measuring 4
mm

- the nodule in the superior segment left lower lobe image 21 series
3 stable measuring 4 mm

- the nodule in the posterior lateral based right lower lobe image
34 series 3 stable measuring 4 mm

There mild areas of scarring within the lung bases.

Central airways are patent.

Stable course dystrophic appearing calcification within the fundus
of the stomach. No acute abnormalities within the visualized upper
abdominal viscera.

There no aggressive appearing osseous lesions. Multilevel
spondylosis within the thoracic spine.
IMPRESSION: Stable bilateral pulmonary nodules. These nodules have been stable
over a period of 2 years consistent with benign findings.

Scarring versus atelectasis within the lung bases

Stable partially calcified nodule left lobe of thyroid
